# Patient Record
Sex: Female | Born: 1980 | Race: White | Hispanic: No | Marital: Married | Smoking: Never smoker
Health system: Southern US, Community
[De-identification: ages and names within clinical notes are randomized; demographics above are authoritative.]

## PROBLEM LIST (undated history)

## (undated) ENCOUNTER — Inpatient Hospital Stay (HOSPITAL_COMMUNITY): Payer: Self-pay

## (undated) DIAGNOSIS — O020 Blighted ovum and nonhydatidiform mole: Secondary | ICD-10-CM

## (undated) DIAGNOSIS — D696 Thrombocytopenia, unspecified: Secondary | ICD-10-CM

## (undated) DIAGNOSIS — R7611 Nonspecific reaction to tuberculin skin test without active tuberculosis: Secondary | ICD-10-CM

## (undated) DIAGNOSIS — O99119 Other diseases of the blood and blood-forming organs and certain disorders involving the immune mechanism complicating pregnancy, unspecified trimester: Secondary | ICD-10-CM

## (undated) DIAGNOSIS — Z3480 Encounter for supervision of other normal pregnancy, unspecified trimester: Principal | ICD-10-CM

## (undated) HISTORY — DX: Encounter for supervision of other normal pregnancy, unspecified trimester: Z34.80

## (undated) HISTORY — DX: Nonspecific reaction to tuberculin skin test without active tuberculosis: R76.11

## (undated) HISTORY — PX: DILATION AND CURETTAGE OF UTERUS: SHX78

## (undated) HISTORY — DX: Blighted ovum and nonhydatidiform mole: O02.0

## (undated) HISTORY — PX: WISDOM TOOTH EXTRACTION: SHX21

---
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
## 2010-01-18 DIAGNOSIS — O020 Blighted ovum and nonhydatidiform mole: Secondary | ICD-10-CM

## 2010-01-18 HISTORY — DX: Blighted ovum and nonhydatidiform mole: O02.0

## 2011-01-19 NOTE — L&D Delivery Note (Signed)
Delivery Note Tinzley Dalia is a 31 y.o. G3P1011 at [redacted]w[redacted]d who presented for induction of labor at 5 cm dilation due to history of precipitous delivery and distance from hospital. At 3:18 PM a viable female was delivered via Vaginal, Spontaneous Delivery (Presentation: OA).  APGAR: 9,9; weight pending. Placenta status: Intact, Spontaneous.  Cord: 3 vessels with the following complications: None.   Anesthesia: Epidural  Episiotomy: None Lacerations: 1st degree Suture Repair: 3.0 vicryl Est. Blood Loss (mL): 350  Mom to postpartum.  Baby to nursery-stable.  [name] 12/31/2011, 3:47 PM

## 2011-05-17 LAB — OB RESULTS CONSOLE ABO/RH: RH Type: POSITIVE

## 2011-05-17 LAB — OB RESULTS CONSOLE HGB/HCT, BLOOD: HCT: 42 %

## 2011-05-17 LAB — OB RESULTS CONSOLE GBS: GBS: NEGATIVE

## 2011-05-17 LAB — OB RESULTS CONSOLE HIV ANTIBODY (ROUTINE TESTING): HIV: NONREACTIVE

## 2011-05-17 LAB — OB RESULTS CONSOLE GC/CHLAMYDIA
Chlamydia: NEGATIVE
Gonorrhea: NEGATIVE

## 2011-05-17 LAB — OB RESULTS CONSOLE RUBELLA ANTIBODY, IGM: Rubella: IMMUNE

## 2011-10-04 ENCOUNTER — Ambulatory Visit (INDEPENDENT_AMBULATORY_CARE_PROVIDER_SITE_OTHER): Payer: BC Managed Care – PPO | Admitting: Physician Assistant

## 2011-10-04 ENCOUNTER — Encounter: Payer: Self-pay | Admitting: Physician Assistant

## 2011-10-04 VITALS — BP 127/77 | Temp 96.5°F | Ht 69.25 in | Wt 192.0 lb

## 2011-10-04 DIAGNOSIS — Z3480 Encounter for supervision of other normal pregnancy, unspecified trimester: Secondary | ICD-10-CM | POA: Insufficient documentation

## 2011-10-04 HISTORY — DX: Encounter for supervision of other normal pregnancy, unspecified trimester: Z34.80

## 2011-10-04 LAB — CBC
HCT: 39.1 % (ref 36.0–46.0)
MCV: 93.3 fL (ref 78.0–100.0)
Platelets: 184 10*3/uL (ref 150–400)
RBC: 4.19 MIL/uL (ref 3.87–5.11)
WBC: 10.3 10*3/uL (ref 4.0–10.5)

## 2011-10-04 NOTE — Patient Instructions (Signed)
Pregnancy - Third Trimester The third trimester of pregnancy (the last 3 months) is a period of the most rapid growth for you and your baby. The baby approaches a length of 20 inches and a weight of 6 to 10 pounds. The baby is adding on fat and getting ready for life outside your body. While inside, babies have periods of sleeping and waking, suck their thumbs, and hiccups. You can often feel small contractions of the uterus. This is false labor. It is also called Braxton-Hicks contractions. This is like a practice for labor. The usual problems in this stage of pregnancy include more difficulty breathing, swelling of the hands and feet from water retention, and having to urinate more often because of the uterus and baby pressing on your bladder.  PRENATAL EXAMS  Blood work may continue to be done during prenatal exams. These tests are done to check on your health and the probable health of your baby. Blood work is used to follow your blood levels (hemoglobin). Anemia (low hemoglobin) is common during pregnancy. Iron and vitamins are given to help prevent this. You may also continue to be checked for diabetes. Some of the past blood tests may be done again.   The size of the uterus is measured during each visit. This makes sure your baby is growing properly according to your pregnancy dates.   Your blood pressure is checked every prenatal visit. This is to make sure you are not getting toxemia.   Your urine is checked every prenatal visit for infection, diabetes and protein.   Your weight is checked at each visit. This is done to make sure gains are happening at the suggested rate and that you and your baby are growing normally.   Sometimes, an ultrasound is performed to confirm the position and the proper growth and development of the baby. This is a test done that bounces harmless sound waves off the baby so your caregiver can more accurately determine due dates.   Discuss the type of pain  medication and anesthesia you will have during your labor and delivery.   Discuss the possibility and anesthesia if a Cesarean Section might be necessary.   Inform your caregiver if there is any mental or physical violence at home.  Sometimes, a specialized non-stress test, contraction stress test and biophysical profile are done to make sure the baby is not having a problem. Checking the amniotic fluid surrounding the baby is called an amniocentesis. The amniotic fluid is removed by sticking a needle into the belly (abdomen). This is sometimes done near the end of pregnancy if an early delivery is required. In this case, it is done to help make sure the baby's lungs are mature enough for the baby to live outside of the womb. If the lungs are not mature and it is unsafe to deliver the baby, an injection of cortisone medication is given to the mother 1 to 2 days before the delivery. This helps the baby's lungs mature and makes it safer to deliver the baby. CHANGES OCCURING IN THE THIRD TRIMESTER OF PREGNANCY Your body goes through many changes during pregnancy. They vary from person to person. Talk to your caregiver about changes you notice and are concerned about.  During the last trimester, you have probably had an increase in your appetite. It is normal to have cravings for certain foods. This varies from person to person and pregnancy to pregnancy.   You may begin to get stretch marks on your hips,   abdomen, and breasts. These are normal changes in the body during pregnancy. There are no exercises or medications to take which prevent this change.   Constipation may be treated with a stool softener or adding bulk to your diet. Drinking lots of fluids, fiber in vegetables, fruits, and whole grains are helpful.   Exercising is also helpful. If you have been very active up until your pregnancy, most of these activities can be continued during your pregnancy. If you have been less active, it is helpful  to start an exercise program such as walking. Consult your caregiver before starting exercise programs.   Avoid all smoking, alcohol, un-prescribed drugs, herbs and "street drugs" during your pregnancy. These chemicals affect the formation and growth of the baby. Avoid chemicals throughout the pregnancy to ensure the delivery of a healthy infant.   Backache, varicose veins and hemorrhoids may develop or get worse.   You will tire more easily in the third trimester, which is normal.   The baby's movements may be stronger and more often.   You may become short of breath easily.   Your belly button may stick out.   A yellow discharge may leak from your breasts called colostrum.   You may have a bloody mucus discharge. This usually occurs a few days to a week before labor begins.  HOME CARE INSTRUCTIONS   Keep your caregiver's appointments. Follow your caregiver's instructions regarding medication use, exercise, and diet.   During pregnancy, you are providing food for you and your baby. Continue to eat regular, well-balanced meals. Choose foods such as meat, fish, milk and other low fat dairy products, vegetables, fruits, and whole-grain breads and cereals. Your caregiver will tell you of the ideal weight gain.   A physical sexual relationship may be continued throughout pregnancy if there are no other problems such as early (premature) leaking of amniotic fluid from the membranes, vaginal bleeding, or belly (abdominal) pain.   Exercise regularly if there are no restrictions. Check with your caregiver if you are unsure of the safety of your exercises. Greater weight gain will occur in the last 2 trimesters of pregnancy. Exercising helps:   Control your weight.   Get you in shape for labor and delivery.   You lose weight after you deliver.   Rest a lot with legs elevated, or as needed for leg cramps or low back pain.   Wear a good support or jogging bra for breast tenderness during  pregnancy. This may help if worn during sleep. Pads or tissues may be used in the bra if you are leaking colostrum.   Do not use hot tubs, steam rooms, or saunas.   Wear your seat belt when driving. This protects you and your baby if you are in an accident.   Avoid raw meat, cat litter boxes and soil used by cats. These carry germs that can cause birth defects in the baby.   It is easier to loose urine during pregnancy. Tightening up and strengthening the pelvic muscles will help with this problem. You can practice stopping your urination while you are going to the bathroom. These are the same muscles you need to strengthen. It is also the muscles you would use if you were trying to stop from passing gas. You can practice tightening these muscles up 10 times a set and repeating this about 3 times per day. Once you know what muscles to tighten up, do not perform these exercises during urination. It is more likely   to cause an infection by backing up the urine.   Ask for help if you have financial, counseling or nutritional needs during pregnancy. Your caregiver will be able to offer counseling for these needs as well as refer you for other special needs.   Make a list of emergency phone numbers and have them available.   Plan on getting help from family or friends when you go home from the hospital.   Make a trial run to the hospital.   Take prenatal classes with the father to understand, practice and ask questions about the labor and delivery.   Prepare the baby's room/nursery.   Do not travel out of the city unless it is absolutely necessary and with the advice of your caregiver.   Wear only low or no heal shoes to have better balance and prevent falling.  MEDICATIONS AND DRUG USE IN PREGNANCY  Take prenatal vitamins as directed. The vitamin should contain 1 milligram of folic acid. Keep all vitamins out of reach of children. Only a couple vitamins or tablets containing iron may be fatal  to a baby or young child when ingested.   Avoid use of all medications, including herbs, over-the-counter medications, not prescribed or suggested by your caregiver. Only take over-the-counter or prescription medicines for pain, discomfort, or fever as directed by your caregiver. Do not use aspirin, ibuprofen (Motrin, Advil, Nuprin) or naproxen (Aleve) unless OK'd by your caregiver.   Let your caregiver also know about herbs you may be using.   Alcohol is related to a number of birth defects. This includes fetal alcohol syndrome. All alcohol, in any form, should be avoided completely. Smoking will cause low birth rate and premature babies.   Street/illegal drugs are very harmful to the baby. They are absolutely forbidden. A baby born to an addicted mother will be addicted at birth. The baby will go through the same withdrawal an adult does.  SEEK MEDICAL CARE IF: You have any concerns or worries during your pregnancy. It is better to call with your questions if you feel they cannot wait, rather than worry about them. DECISIONS ABOUT CIRCUMCISION You may or may not know the sex of your baby. If you know your baby is a boy, it may be time to think about circumcision. Circumcision is the removal of the foreskin of the penis. This is the skin that covers the sensitive end of the penis. There is no proven medical need for this. Often this decision is made on what is popular at the time or based upon religious beliefs and social issues. You can discuss these issues with your caregiver or pediatrician. SEEK IMMEDIATE MEDICAL CARE IF:   An unexplained oral temperature above 102 F (38.9 C) develops, or as your caregiver suggests.   You have leaking of fluid from the vagina (birth canal). If leaking membranes are suspected, take your temperature and tell your caregiver of this when you call.   There is vaginal spotting, bleeding or passing clots. Tell your caregiver of the amount and how many pads are  used.   You develop a bad smelling vaginal discharge with a change in the color from clear to white.   You develop vomiting that lasts more than 24 hours.   You develop chills or fever.   You develop shortness of breath.   You develop burning on urination.   You loose more than 2 pounds of weight or gain more than 2 pounds of weight or as suggested by your   caregiver.   You notice sudden swelling of your face, hands, and feet or legs.   You develop belly (abdominal) pain. Round ligament discomfort is a common non-cancerous (benign) cause of abdominal pain in pregnancy. Your caregiver still must evaluate you.   You develop a severe headache that does not go away.   You develop visual problems, blurred or double vision.   If you have not felt your baby move for more than 1 hour. If you think the baby is not moving as much as usual, eat something with sugar in it and lie down on your left side for an hour. The baby should move at least 4 to 5 times per hour. Call right away if your baby moves less than that.   You fall, are in a car accident or any kind of trauma.   There is mental or physical violence at home.  Document Released: 12/29/2000 Document Revised: 12/24/2010 Document Reviewed: 07/03/2008 Exit[employer] Patient Information 2012 [employer]. 

## 2011-10-04 NOTE — Progress Notes (Signed)
p=72 

## 2011-10-04 NOTE — Progress Notes (Signed)
p-88  This pt is here with her husband today.  She has transferred her care from [facility] in [place].  She is doing her 28 wk labs today.  Last pap was 5/12 WNL

## 2011-10-04 NOTE — Progress Notes (Signed)
New OB transfer from [facility]. Uncomplicated pregnancy thus far. Uncomplicated SVD with previous pregnancy. History remarkable for Molar Pregnancy 2012, no MTX. Will need follow-up quants postpartum to <2 result. Glucola today. Flu and Tdap at next visit. Anticipatory guidance. Discussion of practice, CNMs/MD model. Desires no students/residents. OK with fellow at delivery.

## 2011-10-04 NOTE — Addendum Note (Signed)
Addended by: Granville Lewis on: 10/04/2011 11:49 AM   Modules accepted: Orders

## 2011-10-04 NOTE — Addendum Note (Signed)
Addended by: Granville Lewis on: 10/04/2011 02:11 PM   Modules accepted: Orders

## 2011-10-05 LAB — RPR

## 2011-10-18 ENCOUNTER — Ambulatory Visit (INDEPENDENT_AMBULATORY_CARE_PROVIDER_SITE_OTHER): Payer: BC Managed Care – PPO | Admitting: Obstetrics and Gynecology

## 2011-10-18 VITALS — BP 119/75 | Temp 98.5°F | Wt 192.0 lb

## 2011-10-18 DIAGNOSIS — Z23 Encounter for immunization: Secondary | ICD-10-CM

## 2011-10-18 DIAGNOSIS — Z3480 Encounter for supervision of other normal pregnancy, unspecified trimester: Secondary | ICD-10-CM

## 2011-10-18 MED ORDER — TETANUS-DIPHTH-ACELL PERTUSSIS 5-2.5-18.5 LF-MCG/0.5 IM SUSP: 0.5000 mL | Freq: Once | INTRAMUSCULAR | Status: DC

## 2011-10-18 MED ORDER — INFLUENZA VIRUS VACC SPLIT PF IM SUSP: 0.5000 mL | Freq: Once | INTRAMUSCULAR | Status: DC

## 2011-10-18 NOTE — Patient Instructions (Signed)
Pregnancy - Third Trimester The third trimester of pregnancy (the last 3 months) is a period of the most rapid growth for you and your baby. The baby approaches a length of 20 inches and a weight of 6 to 10 pounds. The baby is adding on fat and getting ready for life outside your body. While inside, babies have periods of sleeping and waking, suck their thumbs, and hiccups. You can often feel small contractions of the uterus. This is false labor. It is also called Braxton-Hicks contractions. This is like a practice for labor. The usual problems in this stage of pregnancy include more difficulty breathing, swelling of the hands and feet from water retention, and having to urinate more often because of the uterus and baby pressing on your bladder.  PRENATAL EXAMS  Blood work may continue to be done during prenatal exams. These tests are done to check on your health and the probable health of your baby. Blood work is used to follow your blood levels (hemoglobin). Anemia (low hemoglobin) is common during pregnancy. Iron and vitamins are given to help prevent this. You may also continue to be checked for diabetes. Some of the past blood tests may be done again.   The size of the uterus is measured during each visit. This makes sure your baby is growing properly according to your pregnancy dates.   Your blood pressure is checked every prenatal visit. This is to make sure you are not getting toxemia.   Your urine is checked every prenatal visit for infection, diabetes and protein.   Your weight is checked at each visit. This is done to make sure gains are happening at the suggested rate and that you and your baby are growing normally.   Sometimes, an ultrasound is performed to confirm the position and the proper growth and development of the baby. This is a test done that bounces harmless sound waves off the baby so your caregiver can more accurately determine due dates.   Discuss the type of pain  medication and anesthesia you will have during your labor and delivery.   Discuss the possibility and anesthesia if a Cesarean Section might be necessary.   Inform your caregiver if there is any mental or physical violence at home.  Sometimes, a specialized non-stress test, contraction stress test and biophysical profile are done to make sure the baby is not having a problem. Checking the amniotic fluid surrounding the baby is called an amniocentesis. The amniotic fluid is removed by sticking a needle into the belly (abdomen). This is sometimes done near the end of pregnancy if an early delivery is required. In this case, it is done to help make sure the baby's lungs are mature enough for the baby to live outside of the womb. If the lungs are not mature and it is unsafe to deliver the baby, an injection of cortisone medication is given to the mother 1 to 2 days before the delivery. This helps the baby's lungs mature and makes it safer to deliver the baby. CHANGES OCCURING IN THE THIRD TRIMESTER OF PREGNANCY Your body goes through many changes during pregnancy. They vary from person to person. Talk to your caregiver about changes you notice and are concerned about.  During the last trimester, you have probably had an increase in your appetite. It is normal to have cravings for certain foods. This varies from person to person and pregnancy to pregnancy.   You may begin to get stretch marks on your hips,   abdomen, and breasts. These are normal changes in the body during pregnancy. There are no exercises or medications to take which prevent this change.   Constipation may be treated with a stool softener or adding bulk to your diet. Drinking lots of fluids, fiber in vegetables, fruits, and whole grains are helpful.   Exercising is also helpful. If you have been very active up until your pregnancy, most of these activities can be continued during your pregnancy. If you have been less active, it is helpful  to start an exercise program such as walking. Consult your caregiver before starting exercise programs.   Avoid all smoking, alcohol, un-prescribed drugs, herbs and "street drugs" during your pregnancy. These chemicals affect the formation and growth of the baby. Avoid chemicals throughout the pregnancy to ensure the delivery of a healthy infant.   Backache, varicose veins and hemorrhoids may develop or get worse.   You will tire more easily in the third trimester, which is normal.   The baby's movements may be stronger and more often.   You may become short of breath easily.   Your belly button may stick out.   A yellow discharge may leak from your breasts called colostrum.   You may have a bloody mucus discharge. This usually occurs a few days to a week before labor begins.  HOME CARE INSTRUCTIONS   Keep your caregiver's appointments. Follow your caregiver's instructions regarding medication use, exercise, and diet.   During pregnancy, you are providing food for you and your baby. Continue to eat regular, well-balanced meals. Choose foods such as meat, fish, milk and other low fat dairy products, vegetables, fruits, and whole-grain breads and cereals. Your caregiver will tell you of the ideal weight gain.   A physical sexual relationship may be continued throughout pregnancy if there are no other problems such as early (premature) leaking of amniotic fluid from the membranes, vaginal bleeding, or belly (abdominal) pain.   Exercise regularly if there are no restrictions. Check with your caregiver if you are unsure of the safety of your exercises. Greater weight gain will occur in the last 2 trimesters of pregnancy. Exercising helps:   Control your weight.   Get you in shape for labor and delivery.   You lose weight after you deliver.   Rest a lot with legs elevated, or as needed for leg cramps or low back pain.   Wear a good support or jogging bra for breast tenderness during  pregnancy. This may help if worn during sleep. Pads or tissues may be used in the bra if you are leaking colostrum.   Do not use hot tubs, steam rooms, or saunas.   Wear your seat belt when driving. This protects you and your baby if you are in an accident.   Avoid raw meat, cat litter boxes and soil used by cats. These carry germs that can cause birth defects in the baby.   It is easier to loose urine during pregnancy. Tightening up and strengthening the pelvic muscles will help with this problem. You can practice stopping your urination while you are going to the bathroom. These are the same muscles you need to strengthen. It is also the muscles you would use if you were trying to stop from passing gas. You can practice tightening these muscles up 10 times a set and repeating this about 3 times per day. Once you know what muscles to tighten up, do not perform these exercises during urination. It is more likely   to cause an infection by backing up the urine.   Ask for help if you have financial, counseling or nutritional needs during pregnancy. Your caregiver will be able to offer counseling for these needs as well as refer you for other special needs.   Make a list of emergency phone numbers and have them available.   Plan on getting help from family or friends when you go home from the hospital.   Make a trial run to the hospital.   Take prenatal classes with the father to understand, practice and ask questions about the labor and delivery.   Prepare the baby's room/nursery.   Do not travel out of the city unless it is absolutely necessary and with the advice of your caregiver.   Wear only low or no heal shoes to have better balance and prevent falling.  MEDICATIONS AND DRUG USE IN PREGNANCY  Take prenatal vitamins as directed. The vitamin should contain 1 milligram of folic acid. Keep all vitamins out of reach of children. Only a couple vitamins or tablets containing iron may be fatal  to a baby or young child when ingested.   Avoid use of all medications, including herbs, over-the-counter medications, not prescribed or suggested by your caregiver. Only take over-the-counter or prescription medicines for pain, discomfort, or fever as directed by your caregiver. Do not use aspirin, ibuprofen (Motrin, Advil, Nuprin) or naproxen (Aleve) unless OK'd by your caregiver.   Let your caregiver also know about herbs you may be using.   Alcohol is related to a number of birth defects. This includes fetal alcohol syndrome. All alcohol, in any form, should be avoided completely. Smoking will cause low birth rate and premature babies.   Street/illegal drugs are very harmful to the baby. They are absolutely forbidden. A baby born to an addicted mother will be addicted at birth. The baby will go through the same withdrawal an adult does.  SEEK MEDICAL CARE IF: You have any concerns or worries during your pregnancy. It is better to call with your questions if you feel they cannot wait, rather than worry about them. DECISIONS ABOUT CIRCUMCISION You may or may not know the sex of your baby. If you know your baby is a boy, it may be time to think about circumcision. Circumcision is the removal of the foreskin of the penis. This is the skin that covers the sensitive end of the penis. There is no proven medical need for this. Often this decision is made on what is popular at the time or based upon religious beliefs and social issues. You can discuss these issues with your caregiver or pediatrician. SEEK IMMEDIATE MEDICAL CARE IF:   An unexplained oral temperature above 102 F (38.9 C) develops, or as your caregiver suggests.   You have leaking of fluid from the vagina (birth canal). If leaking membranes are suspected, take your temperature and tell your caregiver of this when you call.   There is vaginal spotting, bleeding or passing clots. Tell your caregiver of the amount and how many pads are  used.   You develop a bad smelling vaginal discharge with a change in the color from clear to white.   You develop vomiting that lasts more than 24 hours.   You develop chills or fever.   You develop shortness of breath.   You develop burning on urination.   You loose more than 2 pounds of weight or gain more than 2 pounds of weight or as suggested by your   caregiver.   You notice sudden swelling of your face, hands, and feet or legs.   You develop belly (abdominal) pain. Round ligament discomfort is a common non-cancerous (benign) cause of abdominal pain in pregnancy. Your caregiver still must evaluate you.   You develop a severe headache that does not go away.   You develop visual problems, blurred or double vision.   If you have not felt your baby move for more than 1 hour. If you think the baby is not moving as much as usual, eat something with sugar in it and lie down on your left side for an hour. The baby should move at least 4 to 5 times per hour. Call right away if your baby moves less than that.   You fall, are in a car accident or any kind of trauma.   There is mental or physical violence at home.  Document Released: 12/29/2000 Document Revised: 12/24/2010 Document Reviewed: 07/03/2008 Exit[employer] Patient Information 2012 [employer]. 

## 2011-10-18 NOTE — Progress Notes (Signed)
p-83  Noticing some vaginal swelling on occassion

## 2011-10-18 NOTE — Progress Notes (Signed)
Doing well. 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 Concerned that 1st labor only 4 hrs; lives [place]. Pregnancy discomforts and routines discussed. Flu and tDAP today. No irritative vaginitis.

## 2011-10-18 NOTE — Addendum Note (Signed)
Addended by: Granville Lewis on: 10/18/2011 12:05 PM   Modules accepted: Orders

## 2011-11-01 ENCOUNTER — Ambulatory Visit: Payer: BC Managed Care – PPO | Admitting: Advanced Practice Midwife

## 2011-11-01 NOTE — Progress Notes (Signed)
Doing well.  Good fetal movement, denies vaginal bleeding, LOF, regular contractions.  No complaints.

## 2011-11-01 NOTE — Progress Notes (Signed)
p-80 

## 2011-11-15 ENCOUNTER — Ambulatory Visit (INDEPENDENT_AMBULATORY_CARE_PROVIDER_SITE_OTHER): Payer: BC Managed Care – PPO | Admitting: Advanced Practice Midwife

## 2011-11-15 VITALS — BP 117/65 | Temp 97.1°F | Wt 200.0 lb

## 2011-11-15 DIAGNOSIS — Z3480 Encounter for supervision of other normal pregnancy, unspecified trimester: Secondary | ICD-10-CM

## 2011-11-15 NOTE — Addendum Note (Signed)
Addended by: Sharen Counter A on: 11/15/2011 10:20 AM   Modules accepted: Level of Service

## 2011-11-15 NOTE — Progress Notes (Signed)
Doing well.  Good fetal movement, denies vaginal bleeding, LOF, regular contractions.  Has had URI this week, felt warm but no chills.  Better today.

## 2011-11-15 NOTE — Progress Notes (Signed)
p=76 

## 2011-11-30 ENCOUNTER — Ambulatory Visit (INDEPENDENT_AMBULATORY_CARE_PROVIDER_SITE_OTHER): Payer: BC Managed Care – PPO | Admitting: Obstetrics & Gynecology

## 2011-11-30 DIAGNOSIS — Z3480 Encounter for supervision of other normal pregnancy, unspecified trimester: Secondary | ICD-10-CM

## 2011-11-30 NOTE — Progress Notes (Signed)
p-77 

## 2011-11-30 NOTE — Progress Notes (Signed)
No problems.  Will do cultures next week when past 36 weeks.  Pt went to almost 41 weeks with last pregnancy.  No problems.

## 2011-12-08 ENCOUNTER — Ambulatory Visit (INDEPENDENT_AMBULATORY_CARE_PROVIDER_SITE_OTHER): Payer: BC Managed Care – PPO | Admitting: Obstetrics & Gynecology

## 2011-12-08 VITALS — BP 116/73 | Wt 203.0 lb

## 2011-12-08 DIAGNOSIS — Z3480 Encounter for supervision of other normal pregnancy, unspecified trimester: Secondary | ICD-10-CM

## 2011-12-08 LAB — OB RESULTS CONSOLE GBS: GBS: NEGATIVE

## 2011-12-08 NOTE — Progress Notes (Signed)
p=82 

## 2011-12-08 NOTE — Progress Notes (Signed)
Pelvic cultures done. No other complaints or concerns.  Fetal movement and labor precautions reviewed.  

## 2011-12-08 NOTE — Patient Instructions (Signed)
Return to clinic for any obstetric concerns or go to [facility] for evaluation  

## 2011-12-09 LAB — GC/CHLAMYDIA PROBE AMP, GENITAL
Chlamydia, DNA Probe: NEGATIVE
GC Probe Amp, Genital: NEGATIVE

## 2011-12-13 ENCOUNTER — Encounter: Payer: Self-pay | Admitting: Obstetrics & Gynecology

## 2011-12-15 ENCOUNTER — Ambulatory Visit (INDEPENDENT_AMBULATORY_CARE_PROVIDER_SITE_OTHER): Payer: BC Managed Care – PPO | Admitting: Obstetrics & Gynecology

## 2011-12-15 ENCOUNTER — Encounter: Payer: Self-pay | Admitting: Obstetrics & Gynecology

## 2011-12-15 VITALS — BP 124/76 | Temp 98.0°F | Wt 204.0 lb

## 2011-12-15 DIAGNOSIS — Z3480 Encounter for supervision of other normal pregnancy, unspecified trimester: Secondary | ICD-10-CM

## 2011-12-15 NOTE — Progress Notes (Signed)
Routine visit. No OB problems. Good FM. Labor precautions reviewed. GBS negative.

## 2011-12-15 NOTE — Progress Notes (Signed)
p-81 

## 2011-12-22 ENCOUNTER — Ambulatory Visit (INDEPENDENT_AMBULATORY_CARE_PROVIDER_SITE_OTHER): Payer: BC Managed Care – PPO | Admitting: Obstetrics & Gynecology

## 2011-12-22 VITALS — BP 108/73 | Temp 98.0°F | Wt 204.0 lb

## 2011-12-22 DIAGNOSIS — Z3490 Encounter for supervision of normal pregnancy, unspecified, unspecified trimester: Secondary | ICD-10-CM

## 2011-12-22 DIAGNOSIS — Z3480 Encounter for supervision of other normal pregnancy, unspecified trimester: Secondary | ICD-10-CM

## 2011-12-22 NOTE — Progress Notes (Signed)
Discussed labor and induction.  Pt would like to wait until 41 weeks to induce.

## 2011-12-22 NOTE — Progress Notes (Signed)
p-74 

## 2011-12-22 NOTE — Patient Instructions (Signed)
Normal Labor and Delivery  Your caregiver must first be sure you are in labor. Signs of labor include:  · You may pass what is called "the mucus plug" before labor begins. This is a small amount of blood stained mucus.  · Regular uterine contractions.  · The time between contractions get closer together.  · The discomfort and pain gradually gets more intense.  · Pains are mostly located in the back.  · Pains get worse when walking.  · The cervix (the opening of the uterus becomes thinner (begins to efface) and opens up (dilates).  Once you are in labor and admitted into the hospital or care center, your caregiver will do the following:  · A complete physical examination.  · Check your vital signs (blood pressure, pulse, temperature and the fetal heart rate).  · Do a vaginal examination (using a sterile glove and lubricant) to determine:  · The position (presentation) of the baby (head [vertex] or buttock first).  · The level (station) of the baby's head in the birth canal.  · The effacement and dilatation of the cervix.  · You may have your pubic hair shaved and be given an enema depending on your caregiver and the circumstance.  · An electronic monitor is usually placed on your abdomen. The monitor follows the length and intensity of the contractions, as well as the baby's heart rate.  · Usually, your caregiver will insert an IV in your arm with a bottle of sugar water. This is done as a precaution so that medications can be given to you quickly during labor or delivery.  NORMAL LABOR AND DELIVERY IS DIVIDED UP INTO 3 STAGES:  First Stage  This is when regular contractions begin and the cervix begins to efface and dilate. This stage can last from 3 to 15 hours. The end of the first stage is when the cervix is 100% effaced and 10 centimeters dilated. Pain medications may be given by   · Injection (morphine, demerol, etc.)  · Regional anesthesia (spinal, caudal or epidural, anesthetics given in different locations of  the spine). Paracervical pain medication may be given, which is an injection of and anesthetic on each side of the cervix.  A pregnant woman may request to have "Natural Childbirth" which is not to have any medications or anesthesia during her labor and delivery.  Second Stage  This is when the baby comes down through the birth canal (vagina) and is born. This can take 1 to 4 hours. As the baby's head comes down through the birth canal, you may feel like you are going to have a bowel movement. You will get the urge to bear down and push until the baby is delivered. As the baby's head is being delivered, the caregiver will decide if an episiotomy (a cut in the perineum and vagina area) is needed to prevent tearing of the tissue in this area. The episiotomy is sewn up after the delivery of the baby and placenta. Sometimes a mask with nitrous oxide is given for the mother to breath during the delivery of the baby to help if there is too much pain. The end of Stage 2 is when the baby is fully delivered. Then when the umbilical cord stops pulsating it is clamped and cut.  Third Stage  The third stage begins after the baby is completely delivered and ends after the placenta (afterbirth) is delivered. This usually takes 5 to 30 minutes. After the placenta is delivered, a medication   is given either by intravenous or injection to help contract the uterus and prevent bleeding. The third stage is not painful and pain medication is usually not necessary. If an episiotomy was done, it is repaired at this time.  After the delivery, the mother is watched and monitored closely for 1 to 2 hours to make sure there is no postpartum bleeding (hemorrhage). If there is a lot of bleeding, medication is given to contract the uterus and stop the bleeding.  Document Released: 10/14/2007 Document Revised: 03/29/2011 Document Reviewed: 10/14/2007  ExitCare® Patient Information ©2013 [employer].

## 2011-12-28 ENCOUNTER — Inpatient Hospital Stay (HOSPITAL_COMMUNITY): Admission: AD | Admit: 2011-12-28 | Payer: Self-pay | Source: Ambulatory Visit | Admitting: Family Medicine

## 2011-12-29 ENCOUNTER — Ambulatory Visit (INDEPENDENT_AMBULATORY_CARE_PROVIDER_SITE_OTHER): Payer: BC Managed Care – PPO | Admitting: Obstetrics & Gynecology

## 2011-12-29 ENCOUNTER — Telehealth (HOSPITAL_COMMUNITY): Payer: Self-pay | Admitting: *Deleted

## 2011-12-29 ENCOUNTER — Encounter: Payer: Self-pay | Admitting: Obstetrics & Gynecology

## 2011-12-29 VITALS — BP 116/76 | Temp 98.5°F | Wt 205.0 lb

## 2011-12-29 DIAGNOSIS — Z3480 Encounter for supervision of other normal pregnancy, unspecified trimester: Secondary | ICD-10-CM

## 2011-12-29 NOTE — Progress Notes (Signed)
Routine visit. No OB problems. Good FM. Labor precautions reviewed. Per her request, IOL schedule for this Friday at 7 am.

## 2011-12-29 NOTE — Progress Notes (Signed)
p=70 

## 2011-12-29 NOTE — Telephone Encounter (Signed)
Preadmission screen  

## 2011-12-30 ENCOUNTER — Telehealth (HOSPITAL_COMMUNITY): Payer: Self-pay | Admitting: *Deleted

## 2011-12-30 ENCOUNTER — Encounter (HOSPITAL_COMMUNITY): Payer: Self-pay | Admitting: *Deleted

## 2011-12-30 NOTE — Telephone Encounter (Signed)
Preadmission screen  

## 2011-12-31 ENCOUNTER — Inpatient Hospital Stay (HOSPITAL_COMMUNITY): Payer: BC Managed Care – PPO | Admitting: Anesthesiology

## 2011-12-31 ENCOUNTER — Encounter (HOSPITAL_COMMUNITY): Payer: Self-pay | Admitting: Anesthesiology

## 2011-12-31 ENCOUNTER — Encounter (HOSPITAL_COMMUNITY): Payer: Self-pay

## 2011-12-31 ENCOUNTER — Inpatient Hospital Stay (HOSPITAL_COMMUNITY)
Admission: RE | Admit: 2011-12-31 | Discharge: 2012-01-02 | DRG: 373 | Disposition: A | Discharge status: Home / Self Care | Payer: BC Managed Care – PPO | Source: Ambulatory Visit | Attending: Obstetrics & Gynecology | Admitting: Obstetrics & Gynecology

## 2011-12-31 VITALS — BP 106/64 | HR 66 | Temp 98.2°F | Resp 16 | Ht 70.5 in | Wt 204.0 lb

## 2011-12-31 DIAGNOSIS — Z3480 Encounter for supervision of other normal pregnancy, unspecified trimester: Secondary | ICD-10-CM

## 2011-12-31 LAB — CBC
Hemoglobin: 12.9 g/dL (ref 12.0–15.0)
MCH: 32.1 pg (ref 26.0–34.0)
MCV: 93.5 fL (ref 78.0–100.0)
Platelets: 148 10*3/uL — ABNORMAL LOW (ref 150–400)
RBC: 4.02 MIL/uL (ref 3.87–5.11)
WBC: 10.6 10*3/uL — ABNORMAL HIGH (ref 4.0–10.5)

## 2011-12-31 MED ORDER — OXYTOCIN BOLUS FROM INFUSION: 500.0000 mL | INTRAVENOUS | Status: DC

## 2011-12-31 MED ORDER — LACTATED RINGERS IV SOLN: 500.0000 mL | INTRAVENOUS | Status: DC | PRN

## 2011-12-31 MED ORDER — PHENYLEPHRINE 40 MCG/ML (10ML) SYRINGE FOR IV PUSH (FOR BLOOD PRESSURE SUPPORT)
80.0000 ug | PREFILLED_SYRINGE | INTRAVENOUS | Status: DC | PRN
  Filled 2011-12-31: qty 5

## 2011-12-31 MED ORDER — DIPHENHYDRAMINE HCL 50 MG/ML IJ SOLN: 12.5000 mg | INTRAMUSCULAR | Status: DC | PRN

## 2011-12-31 MED ORDER — PRENATAL MULTIVITAMIN CH
1.0000 | ORAL_TABLET | Freq: Every day | ORAL | Status: DC
  Administered 2012-01-01 – 2012-01-02 (×2): 1 via ORAL
  Filled 2011-12-31 (×2): qty 1

## 2011-12-31 MED ORDER — TETANUS-DIPHTH-ACELL PERTUSSIS 5-2.5-18.5 LF-MCG/0.5 IM SUSP: 0.5000 mL | Freq: Once | INTRAMUSCULAR | Status: DC

## 2011-12-31 MED ORDER — LACTATED RINGERS IV SOLN
500.0000 mL | Freq: Once | INTRAVENOUS | Status: AC
  Administered 2011-12-31: 500 mL via INTRAVENOUS

## 2011-12-31 MED ORDER — OXYTOCIN 40 UNITS IN LACTATED RINGERS INFUSION - SIMPLE MED
62.5000 mL/h | INTRAVENOUS | Status: DC
  Administered 2011-12-31: 62.5 mL/h via INTRAVENOUS

## 2011-12-31 MED ORDER — TERBUTALINE SULFATE 1 MG/ML IJ SOLN: 0.2500 mg | Freq: Once | INTRAMUSCULAR | Status: DC | PRN

## 2011-12-31 MED ORDER — LANOLIN HYDROUS EX OINT: TOPICAL_OINTMENT | CUTANEOUS | Status: DC | PRN

## 2011-12-31 MED ORDER — ATROPINE SULFATE 0.4 MG/ML IJ SOLN
0.4000 mg | Freq: Once | INTRAMUSCULAR | Status: AC | PRN
  Administered 2011-12-31: 0.2 mg via INTRAVENOUS
  Filled 2011-12-31: qty 1

## 2011-12-31 MED ORDER — WITCH HAZEL-GLYCERIN EX PADS: 1.0000 "application " | MEDICATED_PAD | CUTANEOUS | Status: DC | PRN

## 2011-12-31 MED ORDER — PHENYLEPHRINE 40 MCG/ML (10ML) SYRINGE FOR IV PUSH (FOR BLOOD PRESSURE SUPPORT): 80.0000 ug | PREFILLED_SYRINGE | INTRAVENOUS | Status: DC | PRN

## 2011-12-31 MED ORDER — CITRIC ACID-SODIUM CITRATE 334-500 MG/5ML PO SOLN: 30.0000 mL | ORAL | Status: DC | PRN

## 2011-12-31 MED ORDER — OXYCODONE-ACETAMINOPHEN 5-325 MG PO TABS: 1.0000 | ORAL_TABLET | ORAL | Status: DC | PRN

## 2011-12-31 MED ORDER — FENTANYL 2.5 MCG/ML BUPIVACAINE 1/10 % EPIDURAL INFUSION (WH - ANES)
14.0000 mL/h | INTRAMUSCULAR | Status: DC
  Administered 2011-12-31: 16 mL/h via EPIDURAL
  Filled 2011-12-31: qty 125

## 2011-12-31 MED ORDER — NALBUPHINE SYRINGE 5 MG/0.5 ML: 10.0000 mg | INJECTION | INTRAMUSCULAR | Status: DC | PRN

## 2011-12-31 MED ORDER — SODIUM BICARBONATE 8.4 % IV SOLN
INTRAVENOUS | Status: DC | PRN
  Administered 2011-12-31: 5 mL via EPIDURAL

## 2011-12-31 MED ORDER — IBUPROFEN 600 MG PO TABS: 600.0000 mg | ORAL_TABLET | Freq: Four times a day (QID) | ORAL | Status: DC | PRN

## 2011-12-31 MED ORDER — SIMETHICONE 80 MG PO CHEW: 80.0000 mg | CHEWABLE_TABLET | ORAL | Status: DC | PRN

## 2011-12-31 MED ORDER — DIPHENHYDRAMINE HCL 25 MG PO CAPS: 25.0000 mg | ORAL_CAPSULE | Freq: Four times a day (QID) | ORAL | Status: DC | PRN

## 2011-12-31 MED ORDER — ONDANSETRON HCL 4 MG/2ML IJ SOLN: 4.0000 mg | INTRAMUSCULAR | Status: DC | PRN

## 2011-12-31 MED ORDER — ACETAMINOPHEN 325 MG PO TABS: 650.0000 mg | ORAL_TABLET | ORAL | Status: DC | PRN

## 2011-12-31 MED ORDER — EPHEDRINE 5 MG/ML INJ
10.0000 mg | INTRAVENOUS | Status: DC | PRN
  Filled 2011-12-31: qty 4

## 2011-12-31 MED ORDER — OXYTOCIN 40 UNITS IN LACTATED RINGERS INFUSION - SIMPLE MED
1.0000 m[IU]/min | INTRAVENOUS | Status: DC
  Administered 2011-12-31: 2 m[IU]/min via INTRAVENOUS
  Filled 2011-12-31: qty 1000

## 2011-12-31 MED ORDER — ONDANSETRON HCL 4 MG/2ML IJ SOLN
4.0000 mg | Freq: Four times a day (QID) | INTRAMUSCULAR | Status: DC | PRN
  Administered 2011-12-31: 4 mg via INTRAVENOUS
  Filled 2011-12-31: qty 2

## 2011-12-31 MED ORDER — ZOLPIDEM TARTRATE 5 MG PO TABS: 5.0000 mg | ORAL_TABLET | Freq: Every evening | ORAL | Status: DC | PRN

## 2011-12-31 MED ORDER — ONDANSETRON HCL 4 MG PO TABS: 4.0000 mg | ORAL_TABLET | ORAL | Status: DC | PRN

## 2011-12-31 MED ORDER — LIDOCAINE HCL (PF) 1 % IJ SOLN: 30.0000 mL | INTRAMUSCULAR | Status: DC | PRN

## 2011-12-31 MED ORDER — BENZOCAINE-MENTHOL 20-0.5 % EX AERO
1.0000 "application " | INHALATION_SPRAY | CUTANEOUS | Status: DC | PRN
  Administered 2011-12-31: 1 via TOPICAL
  Filled 2011-12-31: qty 56

## 2011-12-31 MED ORDER — SENNOSIDES-DOCUSATE SODIUM 8.6-50 MG PO TABS
2.0000 | ORAL_TABLET | Freq: Every day | ORAL | Status: DC
  Administered 2011-12-31 – 2012-01-01 (×2): 2 via ORAL

## 2011-12-31 MED ORDER — LACTATED RINGERS IV SOLN
INTRAVENOUS | Status: DC
  Administered 2011-12-31 (×2): via INTRAVENOUS

## 2011-12-31 MED ORDER — EPHEDRINE 5 MG/ML INJ: 10.0000 mg | INTRAVENOUS | Status: DC | PRN

## 2011-12-31 MED ORDER — DIBUCAINE 1 % RE OINT: 1.0000 "application " | TOPICAL_OINTMENT | RECTAL | Status: DC | PRN

## 2011-12-31 MED ORDER — IBUPROFEN 600 MG PO TABS
600.0000 mg | ORAL_TABLET | Freq: Four times a day (QID) | ORAL | Status: DC
  Administered 2011-12-31 – 2012-01-02 (×7): 600 mg via ORAL
  Filled 2011-12-31 (×7): qty 1

## 2011-12-31 NOTE — Anesthesia Preprocedure Evaluation (Signed)

## 2011-12-31 NOTE — Progress Notes (Signed)
Spoke with Dr. Sherron Ales and notified him of pt request for epidural. He ordered to have 0.4 mg Atropine at bedside due to pt vagal response to IV sticks, vaginal exams. Pt becomes very symptomatic with vagal response.

## 2011-12-31 NOTE — Anesthesia Procedure Notes (Signed)

## 2011-12-31 NOTE — Progress Notes (Signed)
Danielle Vincent is a 31 y.o. G3P1011 at [redacted]w[redacted]d admitted for induction of labor due to advanced dilation, hx precipitous delivery, distance from hospital.  Subjective: Blocked and comfortable.   Objective: BP 120/63  Pulse 58  Temp 98.5 F (36.9 C) (Oral)  Resp 20  Ht 5' 10.5" (1.791 m)  Wt 92.534 kg (204 lb)  BMI 28.86 kg/m2  SpO2 87%  LMP 03/19/2011      FHT:  FHR: 130 bpm, variability: moderate,  accelerations:  Present,  decelerations:  Absent UC:   regular, every 3-4 minutes SVE:   Dilation: 5 Effacement (%): 70 Station: +1 Exam by:: Dr. Thad Vincent Anterior, very stretchy cervix   Labs: Lab Results  Component Value Date   WBC 10.6* 12/31/2011   HGB 12.9 12/31/2011   HCT 37.6 12/31/2011   MCV 93.5 12/31/2011   PLT 148* 12/31/2011    Assessment / Plan: IOL progressing well on pitocin  Labor: progressing on pitocin, AROM clear Preeclampsia:  n/a Fetal Wellbeing:  Category I Pain Control:  Epidural I/D:  n/a Anticipated MOD:  NSVD  Danielle Vincent 12/31/2011, 12:21 PM

## 2011-12-31 NOTE — H&P (Signed)
Danielle Vincent is a 31 y.o. female presenting for IOL for advanced dilation (5 cm in office), hx fast labor and distance from hospital. Maternal Medical History:  Reason for admission: Reason for Admission:   nauseaIOL for advanced dilation, distance from hospital  Fetal activity: Perceived fetal activity is normal.   Last perceived fetal movement was within the past hour.    Prenatal complications: no prenatal complications Prenatal Complications - Diabetes: none.    OB History    Grav Para Term Preterm Abortions TAB SAB Ect Mult Living   3 1 1  1  1   1      Past Medical History  Diagnosis Date  . Supervision of other normal pregnancy 10/04/2011     Genetic Screen Neg [place], XX Anatomic [place] Normal female Glucose Screen  GBS  Feeding Preference Breast Contraception  Circumcision n/a    . Molar pregnancy 2012  . Positive PPD, treated    Past Surgical History  Procedure Date  . Knee surgery 1995  . Dilation and curettage of uterus    Family History: family history includes Cancer in her maternal grandmother; Diabetes in her maternal grandmother, paternal grandfather, and paternal grandmother; Hyperlipidemia in her maternal grandmother and mother; and Osteoporosis in her maternal grandmother. Social History:  reports that she has never smoked. She has never used smokeless tobacco. She reports that she does not drink alcohol or use illicit drugs.   Prenatal Transfer Tool  Maternal Diabetes: No Genetic Screening: Normal Maternal Ultrasounds/Referrals: Normal Fetal Ultrasounds or other Referrals:  None Maternal Substance Abuse:  No Significant Maternal Medications:  None Significant Maternal Lab Results:  Lab values include: Group B Strep negative Other Comments:  None  Review of Systems  Constitutional: Negative for fever and chills.  Eyes: Negative for blurred vision and double vision.  Gastrointestinal: Negative for nausea and vomiting.  Genitourinary: Negative  for dysuria.  Neurological: Negative for headaches.      Blood pressure 119/66, pulse 66, temperature 98.5 F (36.9 C), temperature source Oral, resp. rate 20, height 5' 10.5" (1.791 m), weight 92.534 kg (204 lb), last menstrual period 03/19/2011. Maternal Exam:  Pelvis: adequate for delivery.   Cervix: Cervix evaluated by digital exam.     Fetal Exam Fetal Monitor Review: Mode: ultrasound.   Baseline rate: 135.  Variability: moderate (6-25 bpm).   Pattern: accelerations present and no decelerations.       Physical Exam  Constitutional: She is oriented to person, place, and time. She appears well-developed and well-nourished. No distress.  HENT:  Head: Normocephalic and atraumatic.  Eyes: Conjunctivae normal and EOM are normal.  Neck: Normal range of motion.  Cardiovascular: Normal rate.   Respiratory: Effort normal. No respiratory distress.  GI: There is no tenderness.  Musculoskeletal: Normal range of motion. She exhibits no edema and no tenderness.  Neurological: She is alert and oriented to person, place, and time.  Skin: Skin is warm and dry.  Psychiatric: She has a normal mood and affect.    Prenatal labs: ABO, Rh: A/Positive/-- (04/29 0000) Antibody: Negative (04/29 0000) Rubella: Immune (04/29 0000) RPR: NON REAC (09/16 1023)  HBsAg: Negative (04/29 0000)  HIV: NON REACTIVE (09/16 1023)  GBS: Negative (11/20 0000)   Assessment/Plan: 31 y.o. G3P1011 at [redacted]w[redacted]d with advanced dilation - IOL with pitocin - AROM when ready - Epidural as needed.   [name] 12/31/2011, 9:16 AM

## 2012-01-01 LAB — CBC
Hemoglobin: 12.2 g/dL (ref 12.0–15.0)
MCH: 31.6 pg (ref 26.0–34.0)
Platelets: 145 10*3/uL — ABNORMAL LOW (ref 150–400)
RBC: 3.86 MIL/uL — ABNORMAL LOW (ref 3.87–5.11)

## 2012-01-01 NOTE — Anesthesia Postprocedure Evaluation (Signed)
  Anesthesia Post-op Note  Patient: Danielle Vincent  Procedure(s) Performed: * No procedures listed *  Patient Location: Mother/Baby  Anesthesia Type:Epidural  Level of Consciousness: awake, alert  and oriented  Airway and Oxygen Therapy: Patient Spontanous Breathing  Post-op Pain: mild  Post-op Assessment: Patient's Cardiovascular Status Stable, Respiratory Function Stable, Patent Airway, No signs of Nausea or vomiting, Pain level controlled and No headache  Post-op Vital Signs: stable  Complications: No apparent anesthesia complications

## 2012-01-01 NOTE — Progress Notes (Signed)
Post Partum Day #1 Subjective: no complaints and up ad lib; breastfeeding; considering NuvaRing once milk established; declines early d/c   Objective: Blood pressure 102/58, pulse 64, temperature 97.6 F (36.4 C), temperature source Oral, resp. rate 18, height 5' 10.5" (1.791 m), weight 92.534 kg (204 lb), last menstrual period 03/19/2011, SpO2 87.00%, unknown if currently breastfeeding.  Physical Exam:  General: alert, cooperative and no distress Lochia: appropriate Uterine Fundus: firm DVT Evaluation: No evidence of DVT seen on physical exam.   Basename 01/01/12 0530 12/31/11 0825  HGB 12.2 12.9  HCT 36.3 37.6    Assessment/Plan: Plan for discharge tomorrow   LOS: 1 day   Cam Hai 01/01/2012, 7:44 AM

## 2012-01-02 MED ORDER — IBUPROFEN 600 MG PO TABS: 600.0000 mg | ORAL_TABLET | Freq: Four times a day (QID) | ORAL | Status: DC | PRN

## 2012-01-02 NOTE — Discharge Summary (Signed)
Obstetric Discharge Summary Reason for Admission: induction of labor d/t 5cm in office, h/o rapid labor, long distance from hosp Prenatal Procedures: ultrasound Intrapartum Procedures: spontaneous vaginal delivery Postpartum Procedures: none Complications-Operative and Postpartum: 1st degree perineal laceration Eating, drinking, voiding, ambulating well.  +flatus.  Lochia and pain wnl.  No complaints.   Hemoglobin  Date Value Range Status  01/01/2012 12.2  12.0 - 15.0 g/dL Final  1/61/0960 45.4   Final     HCT  Date Value Range Status  01/01/2012 36.3  36.0 - 46.0 % Final  05/17/2011 42   Final    Physical Exam:  General: alert, cooperative and no distress Lochia: appropriate Uterine Fundus: firm Incision: n/a DVT Evaluation: No evidence of DVT seen on physical exam. Negative Homan's sign. No cords or calf tenderness. No significant calf/ankle edema.  Discharge Diagnoses: Term Pregnancy-delivered  Discharge Information: Date: 01/02/2012 Activity: pelvic rest Diet: routine Medications: PNV and Ibuprofen Condition: stable Instructions: refer to practice specific booklet Discharge to: home Follow-up Information    Schedule an appointment as soon as possible for a visit with [facility]. (in 4-6 weeks for your postpartum visit. )    Contact information:   [address] [place] [address]          Newborn Data: Live born female  Birth Weight: 7 lb 4.4 oz (3300 g) APGAR: 9, 9  Home with mother. Breastfeeding well. States wants nuvaring in 3months when milk supply well established.   Marge Duncans 01/02/2012, 6:55 AM

## 2012-01-03 NOTE — Discharge Summary (Signed)
Attestation of Attending Supervision of Advanced Practitioner (CNM/NP): Evaluation and management procedures were performed by the Advanced Practitioner under my supervision and collaboration.  I have reviewed the Advanced Practitioner's note and chart, and I agree with the management and plan.  Meloney Feld 01/03/2012 3:42 PM   

## 2012-01-03 NOTE — H&P (Signed)
Attestation of Attending Supervision of Advanced Practitioner (CNM/NP): Evaluation and management procedures were performed by the Advanced Practitioner under my supervision and collaboration.  I have reviewed the Advanced Practitioner's note and chart, and I agree with the management and plan.  Sutter Ahlgren 01/03/2012 3:42 PM   

## 2012-02-18 ENCOUNTER — Ambulatory Visit: Payer: BC Managed Care – PPO

## 2012-02-25 ENCOUNTER — Encounter: Payer: Self-pay | Admitting: Advanced Practice Midwife

## 2012-02-25 ENCOUNTER — Ambulatory Visit (INDEPENDENT_AMBULATORY_CARE_PROVIDER_SITE_OTHER): Payer: BC Managed Care – PPO | Admitting: Advanced Practice Midwife

## 2012-02-25 VITALS — BP 141/68 | HR 71 | Resp 16 | Ht 70.0 in | Wt 186.0 lb

## 2012-02-25 DIAGNOSIS — O09A0 Supervision of pregnancy with history of molar pregnancy, unspecified trimester: Secondary | ICD-10-CM

## 2012-02-25 LAB — HCG, QUANTITATIVE, PREGNANCY: hCG, Beta Chain, Quant, S: <2 m[IU]/mL

## 2012-02-25 MED ORDER — ETONOGESTREL-ETHINYL ESTRADIOL 0.12-0.015 MG/24HR VA RING: VAGINAL_RING | VAGINAL | Status: DC

## 2012-02-25 NOTE — Progress Notes (Signed)
  Subjective:     Danielle Vincent is a 32 y.o. female who presents for a postpartum visit. She is 7 weeks postpartum following a spontaneous vaginal delivery with first degree laceration. I have fully reviewed the prenatal and intrapartum course.  Outcome: spontaneous vaginal delivery. Anesthesia: epidural. Postpartum course has been normal. Baby's course has been normal. Baby is feeding by breast. Bleeding no bleeding. Bowel function is normal. Bladder function is normal. Patient is sexually active. Contraception method is none. Postpartum depression screening: negative.  The following portions of the patient's history were reviewed and updated as appropriate: allergies, current medications, past family history, past medical history, past social history, past surgical history and problem list.  Review of Systems A comprehensive review of systems was negative.   Objective:    BP 141/68  Pulse 71  Resp 16  Ht 5\' 10"  (1.778 m)  Wt 186 lb (84.369 kg)  BMI 26.69 kg/m2  Breastfeeding? Yes  General:  alert, cooperative, appears stated age and no distress   Breasts:  negative  Lungs: clear to auscultation bilaterally  Heart:  regular rate and rhythm, S1, S2 normal, no murmur, click, rub or gallop  Abdomen: soft, non-tender; bowel sounds normal; no masses,  no organomegaly   Vulva:  not evaluated  Vagina: not evaluated  Cervix:  not evaluated  Corpus: not examined  Adnexa:  not evaluated  Rectal Exam: Not performed.        Assessment:    Routine postpartum exam. Pap smear not done at today's visit.   Plan:    1. Contraception: NuvaRing vaginal inserts. Discussed potential effects on breastmilk, pt aware.   2. Follow up in: 1 year or as needed.

## 2012-02-28 ENCOUNTER — Telehealth: Payer: Self-pay | Admitting: *Deleted

## 2012-02-28 NOTE — Telephone Encounter (Signed)
LM on voicemail of neg BHCG results 

## 2013-01-18 NOTE — L&D Delivery Note (Signed)
Delivery Note At 8:00 AM a viable female was delivered via Vaginal, Spontaneous Delivery (Presentation: Left Occiput Anterior).  APGAR: 9, 9; weight .   Placenta status: Intact, Spontaneous.  Cord: 3 vessels with the following complications: Short.  Anesthesia: Epidural  Episiotomy: None Lacerations: small bleeding abrasion Suture Repair: vicryl rapide 4.0 Est. Blood Loss (mL): 200  Mom to postpartum.  Baby to Couplet care / Skin to Skin.  Danielle Vincent [name] 10/14/2013, 8:52 AM

## 2013-03-14 ENCOUNTER — Encounter: Payer: Self-pay | Admitting: Advanced Practice Midwife

## 2013-03-14 ENCOUNTER — Ambulatory Visit (INDEPENDENT_AMBULATORY_CARE_PROVIDER_SITE_OTHER): Payer: BC Managed Care – PPO | Admitting: Advanced Practice Midwife

## 2013-03-14 VITALS — BP 124/71 | Wt 164.0 lb

## 2013-03-14 DIAGNOSIS — Z124 Encounter for screening for malignant neoplasm of cervix: Secondary | ICD-10-CM

## 2013-03-14 DIAGNOSIS — Z1151 Encounter for screening for human papillomavirus (HPV): Secondary | ICD-10-CM

## 2013-03-14 DIAGNOSIS — Z3480 Encounter for supervision of other normal pregnancy, unspecified trimester: Secondary | ICD-10-CM | POA: Insufficient documentation

## 2013-03-14 DIAGNOSIS — Z23 Encounter for immunization: Secondary | ICD-10-CM

## 2013-03-14 NOTE — Progress Notes (Signed)
p-82  Bedside U/S  Showed IUP with CRL of 28.8 and FHT 176

## 2013-03-14 NOTE — Progress Notes (Signed)
Subjective:    Danielle Vincent is a [id number] [redacted]w[redacted]d being seen today for her first obstetrical visit.  Her obstetrical history is significant for NSVD at term x2 and molar pregnancy x1. Patient does intend to breast feed. Pregnancy history fully reviewed.  Patient reports nausea and denies need for treatment at this time.  Filed Vitals:   03/14/13 1334  BP: 124/71  Weight: 164 lb (74.39 kg)    HISTORY: OB History  Gravida Para Term Preterm AB SAB TAB Ectopic Multiple Living  4 2 2  1 1    2     # Outcome Date GA Lbr Len/2nd Weight Sex Delivery Anes PTL Lv  4 CUR           3 TRM 12/31/11 [redacted]w[redacted]d 01:06 / 00:12 7 lb 4.4 oz (3.3 kg) F SVD EPI  Y     Comments: na  2 TRM 02/11/09 [redacted]w[redacted]d  7 lb 13 oz (3.544 kg) F SVD EPI  Y  1 SAB              Past Medical History  Diagnosis Date  . Supervision of other normal pregnancy 10/04/2011     Genetic Screen Neg [place], XX Anatomic [place] Normal female Glucose Screen  GBS  Feeding Preference Breast Contraception  Circumcision n/a    . Molar pregnancy 2012  . Positive PPD, treated    Past Surgical History  Procedure Laterality Date  . Knee surgery  1995  . Dilation and curettage of uterus     Family History  Problem Relation Age of Onset  . Cancer Maternal Grandmother     lung  . Diabetes Paternal Grandmother   . Diabetes Paternal Grandfather   . Diabetes Maternal Grandmother   . Hyperlipidemia Mother   . Hyperlipidemia Maternal Grandmother   . Osteoporosis Maternal Grandmother      Exam    Uterus:   ~9 week size  Pelvic Exam:    Perineum: No Hemorrhoids, Normal Perineum   Vulva: normal   Vagina:  normal mucosa, normal discharge   pH:    Cervix: multiparous appearance, no bleeding following Pap, no cervical motion tenderness and no lesions   Adnexa: normal adnexa and no mass, fullness, tenderness   Bony Pelvis: average  System: Breast:  normal appearance, no masses or tenderness   Skin: normal coloration and turgor, no  rashes    Neurologic: oriented, normal, gait normal; reflexes normal and symmetric   Extremities: normal strength, tone, and muscle mass, ROM of all joints is normal   HEENT neck supple with midline trachea and thyroid without masses   Mouth/Teeth mucous membranes moist, pharynx normal without lesions   Neck supple and no masses   Cardiovascular: regular rate and rhythm   Respiratory:  appears well, vitals normal, no respiratory distress, acyanotic, normal RR, ear and throat exam is normal, neck free of mass or lymphadenopathy, chest clear, no wheezing, crepitations, rhonchi, normal symmetric air entry   Abdomen: soft, non-tender; bowel sounds normal; no masses,  no organomegaly   Urinary: urethral meatus normal      Assessment:    Pregnancy: [id number] There are no active problems to display for this patient.       Plan:     Initial labs drawn. Prenatal vitamins. Problem list reviewed and updated. Genetic Screening discussed : Panorama ordered and drawn in office today at pt request.  Ultrasound discussed; fetal survey: requested.  Follow up in 4 weeks. 50% of 30 min visit  spent on counseling and coordination of care.     [name], [name] 03/14/2013

## 2013-03-15 ENCOUNTER — Encounter: Payer: Self-pay | Admitting: Advanced Practice Midwife

## 2013-03-15 DIAGNOSIS — O99119 Other diseases of the blood and blood-forming organs and certain disorders involving the immune mechanism complicating pregnancy, unspecified trimester: Secondary | ICD-10-CM

## 2013-03-15 DIAGNOSIS — D696 Thrombocytopenia, unspecified: Secondary | ICD-10-CM | POA: Insufficient documentation

## 2013-03-15 LAB — OBSTETRIC PANEL
Antibody Screen: NEGATIVE
Basophils Absolute: 0 10*3/uL (ref 0.0–0.1)
Basophils Relative: 0 % (ref 0–1)
EOS ABS: 0 10*3/uL (ref 0.0–0.7)
EOS PCT: 0 % (ref 0–5)
HEMATOCRIT: 39.1 % (ref 36.0–46.0)
HEMOGLOBIN: 13.2 g/dL (ref 12.0–15.0)
Hepatitis B Surface Ag: NEGATIVE
LYMPHS ABS: 2 10*3/uL (ref 0.7–4.0)
LYMPHS PCT: 22 % (ref 12–46)
MCH: 31 pg (ref 26.0–34.0)
MCHC: 33.8 g/dL (ref 30.0–36.0)
MCV: 91.8 fL (ref 78.0–100.0)
MONO ABS: 0.5 10*3/uL (ref 0.1–1.0)
MONOS PCT: 5 % (ref 3–12)
Neutro Abs: 6.8 10*3/uL (ref 1.7–7.7)
Neutrophils Relative %: 73 % (ref 43–77)
Platelets: 193 10*3/uL (ref 150–400)
RBC: 4.26 MIL/uL (ref 3.87–5.11)
RDW: 13.3 % (ref 11.5–15.5)
RH TYPE: POSITIVE
RUBELLA: 1.45 {index} — AB (ref <0.90)
WBC: 9.3 10*3/uL (ref 4.0–10.5)

## 2013-03-15 LAB — GC/CHLAMYDIA PROBE AMP
CT Probe RNA: NEGATIVE
GC Probe RNA: NEGATIVE

## 2013-03-15 LAB — HIV ANTIBODY (ROUTINE TESTING W REFLEX): HIV: NONREACTIVE

## 2013-03-17 LAB — CULTURE, URINE COMPREHENSIVE: Colony Count: 60000

## 2013-03-23 ENCOUNTER — Other Ambulatory Visit: Payer: BC Managed Care – PPO

## 2013-03-23 DIAGNOSIS — Z20828 Contact with and (suspected) exposure to other viral communicable diseases: Secondary | ICD-10-CM

## 2013-03-23 NOTE — Progress Notes (Signed)
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

## 2013-03-27 LAB — PARVOVIRUS B19 ANTIBODY, IGG AND IGM
PAROVIRUS B19 IGM ABS: 0.3 {index} (ref <0.9)
Parovirus B19 IgG Abs: 5.6 index — ABNORMAL HIGH (ref <0.9)

## 2013-03-28 ENCOUNTER — Telehealth: Payer: Self-pay | Admitting: *Deleted

## 2013-03-28 NOTE — Telephone Encounter (Signed)
Pt notified of Parvo results.  Spoke with Dr Roselie Awkward who agreed that results showed pt has immunity but not recent exposure.

## 2013-04-11 ENCOUNTER — Ambulatory Visit (INDEPENDENT_AMBULATORY_CARE_PROVIDER_SITE_OTHER): Payer: BC Managed Care – PPO | Admitting: Obstetrics & Gynecology

## 2013-04-11 ENCOUNTER — Encounter: Payer: Self-pay | Admitting: Advanced Practice Midwife

## 2013-04-11 ENCOUNTER — Encounter: Payer: Self-pay | Admitting: Obstetrics & Gynecology

## 2013-04-11 ENCOUNTER — Encounter: Payer: Self-pay | Admitting: *Deleted

## 2013-04-11 VITALS — BP 112/64 | Wt 161.0 lb

## 2013-04-11 DIAGNOSIS — Z3480 Encounter for supervision of other normal pregnancy, unspecified trimester: Secondary | ICD-10-CM

## 2013-04-11 NOTE — Progress Notes (Signed)
P - 67

## 2013-04-11 NOTE — Progress Notes (Signed)
Routine visit. No more bleeding since the occasion last week. She is on pelvic rest. A+. NIPS normal female.

## 2013-05-10 ENCOUNTER — Ambulatory Visit (INDEPENDENT_AMBULATORY_CARE_PROVIDER_SITE_OTHER): Payer: BC Managed Care – PPO | Admitting: Obstetrics & Gynecology

## 2013-05-10 VITALS — BP 132/76 | HR 70 | Wt 164.0 lb

## 2013-05-10 DIAGNOSIS — Z3480 Encounter for supervision of other normal pregnancy, unspecified trimester: Secondary | ICD-10-CM

## 2013-05-10 NOTE — Progress Notes (Signed)
Pt doing well.  Will recheck cbc for plt count.  Anatomy US scheduled.

## 2013-05-11 LAB — CBC
HCT: 37.7 % (ref 36.0–46.0)
Hemoglobin: 13.1 g/dL (ref 12.0–15.0)
MCH: 31.8 pg (ref 26.0–34.0)
MCHC: 34.7 g/dL (ref 30.0–36.0)
MCV: 91.5 fL (ref 78.0–100.0)
PLATELETS: 197 10*3/uL (ref 150–400)
RBC: 4.12 MIL/uL (ref 3.87–5.11)
RDW: 13.5 % (ref 11.5–15.5)
WBC: 10.3 10*3/uL (ref 4.0–10.5)

## 2013-05-22 ENCOUNTER — Ambulatory Visit (HOSPITAL_COMMUNITY)
Admission: RE | Admit: 2013-05-22 | Discharge: 2013-05-22 | Disposition: A | Discharge status: Home / Self Care | Payer: BC Managed Care – PPO | Source: Ambulatory Visit | Attending: Obstetrics & Gynecology | Admitting: Obstetrics & Gynecology

## 2013-05-22 DIAGNOSIS — O358XX0 Maternal care for other (suspected) fetal abnormality and damage, not applicable or unspecified: Secondary | ICD-10-CM | POA: Insufficient documentation

## 2013-05-22 DIAGNOSIS — Z3480 Encounter for supervision of other normal pregnancy, unspecified trimester: Secondary | ICD-10-CM

## 2013-05-22 DIAGNOSIS — Z1389 Encounter for screening for other disorder: Secondary | ICD-10-CM | POA: Insufficient documentation

## 2013-05-22 DIAGNOSIS — Z363 Encounter for antenatal screening for malformations: Secondary | ICD-10-CM | POA: Insufficient documentation

## 2013-05-23 ENCOUNTER — Encounter: Payer: Self-pay | Admitting: Obstetrics & Gynecology

## 2013-05-26 ENCOUNTER — Encounter: Payer: Self-pay | Admitting: Family Medicine

## 2013-06-06 ENCOUNTER — Ambulatory Visit (INDEPENDENT_AMBULATORY_CARE_PROVIDER_SITE_OTHER): Payer: BC Managed Care – PPO | Admitting: Obstetrics & Gynecology

## 2013-06-06 ENCOUNTER — Encounter: Payer: Self-pay | Admitting: Obstetrics & Gynecology

## 2013-06-06 VITALS — BP 139/74 | HR 84 | Wt 170.0 lb

## 2013-06-06 DIAGNOSIS — Z3480 Encounter for supervision of other normal pregnancy, unspecified trimester: Secondary | ICD-10-CM

## 2013-06-06 NOTE — Progress Notes (Signed)
Routine visit. Good FM. No problems. Going to [employer] next week. Has access to My Chart. MSAFP today.

## 2013-06-06 NOTE — Progress Notes (Signed)
Baby kicks very low in pelvic

## 2013-06-07 ENCOUNTER — Encounter: Payer: BC Managed Care – PPO | Admitting: Obstetrics & Gynecology

## 2013-06-08 LAB — ALPHA FETOPROTEIN, MATERNAL
AFP: 62.8 IU/mL
Curr Gest Age: 21.4 wks.days
MoM for AFP: 1.03
OPEN SPINA BIFIDA: NEGATIVE

## 2013-06-12 ENCOUNTER — Encounter: Payer: Self-pay | Admitting: Obstetrics & Gynecology

## 2013-07-04 ENCOUNTER — Encounter: Payer: Self-pay | Admitting: Obstetrics & Gynecology

## 2013-07-04 ENCOUNTER — Ambulatory Visit (INDEPENDENT_AMBULATORY_CARE_PROVIDER_SITE_OTHER): Payer: BC Managed Care – PPO | Admitting: Obstetrics & Gynecology

## 2013-07-04 VITALS — BP 123/73 | HR 76 | Wt 177.0 lb

## 2013-07-04 DIAGNOSIS — Z3480 Encounter for supervision of other normal pregnancy, unspecified trimester: Secondary | ICD-10-CM

## 2013-07-04 NOTE — Progress Notes (Signed)
Routine visit. Good FM. Anatomy follow up ordered. Glucola, tdap, labs at next visit. No problems. [place] was fun!

## 2013-07-05 ENCOUNTER — Ambulatory Visit (HOSPITAL_COMMUNITY)
Admission: RE | Admit: 2013-07-05 | Discharge: 2013-07-05 | Disposition: A | Discharge status: Home / Self Care | Payer: BC Managed Care – PPO | Source: Ambulatory Visit | Attending: Obstetrics & Gynecology | Admitting: Obstetrics & Gynecology

## 2013-07-05 DIAGNOSIS — Z3689 Encounter for other specified antenatal screening: Secondary | ICD-10-CM | POA: Insufficient documentation

## 2013-07-05 DIAGNOSIS — Z3480 Encounter for supervision of other normal pregnancy, unspecified trimester: Secondary | ICD-10-CM

## 2013-07-18 ENCOUNTER — Ambulatory Visit (INDEPENDENT_AMBULATORY_CARE_PROVIDER_SITE_OTHER): Payer: BC Managed Care – PPO | Admitting: Obstetrics & Gynecology

## 2013-07-18 VITALS — BP 125/65 | Wt 179.0 lb

## 2013-07-18 DIAGNOSIS — Z3482 Encounter for supervision of other normal pregnancy, second trimester: Secondary | ICD-10-CM

## 2013-07-18 DIAGNOSIS — Z3480 Encounter for supervision of other normal pregnancy, unspecified trimester: Secondary | ICD-10-CM

## 2013-07-18 DIAGNOSIS — Z23 Encounter for immunization: Secondary | ICD-10-CM

## 2013-07-18 LAB — CBC
HCT: 36.8 % (ref 36.0–46.0)
HEMOGLOBIN: 12.5 g/dL (ref 12.0–15.0)
MCH: 32.3 pg (ref 26.0–34.0)
MCHC: 34 g/dL (ref 30.0–36.0)
MCV: 95.1 fL (ref 78.0–100.0)
Platelets: 172 10*3/uL (ref 150–400)
RBC: 3.87 MIL/uL (ref 3.87–5.11)
RDW: 13.3 % (ref 11.5–15.5)
WBC: 8.8 10*3/uL (ref 4.0–10.5)

## 2013-07-18 MED ORDER — TETANUS-DIPHTH-ACELL PERTUSSIS 5-2.5-18.5 LF-MCG/0.5 IM SUSP
0.5000 mL | Freq: Once | INTRAMUSCULAR | Status: AC
  Administered 2013-07-18: 0.5 mL via INTRAMUSCULAR

## 2013-07-18 NOTE — Progress Notes (Signed)
Nml anatomy follow up.  Discussed weight gain  At 15 pounds.  Goal is 25-35.  Travel to [place].  Walk frequently and wear compression stockings.  GCT and tdap today.

## 2013-07-19 ENCOUNTER — Telehealth: Payer: Self-pay | Admitting: *Deleted

## 2013-07-19 LAB — HIV ANTIBODY (ROUTINE TESTING W REFLEX): HIV 1&2 Ab, 4th Generation: NONREACTIVE

## 2013-07-19 LAB — GLUCOSE TOLERANCE, 1 HOUR (50G) W/O FASTING: GLUCOSE 1 HOUR GTT: 81 mg/dL (ref 70–140)

## 2013-07-19 LAB — RPR

## 2013-07-19 NOTE — Telephone Encounter (Signed)
LM on voicemail of normal 1 hr GTT. 

## 2013-07-24 ENCOUNTER — Encounter: Payer: Self-pay | Admitting: Obstetrics & Gynecology

## 2013-08-08 ENCOUNTER — Ambulatory Visit (INDEPENDENT_AMBULATORY_CARE_PROVIDER_SITE_OTHER): Payer: BC Managed Care – PPO | Admitting: Obstetrics & Gynecology

## 2013-08-08 ENCOUNTER — Inpatient Hospital Stay (HOSPITAL_COMMUNITY): Payer: BC Managed Care – PPO

## 2013-08-08 ENCOUNTER — Encounter (HOSPITAL_COMMUNITY): Payer: Self-pay | Admitting: *Deleted

## 2013-08-08 ENCOUNTER — Inpatient Hospital Stay (HOSPITAL_COMMUNITY)
Admission: AD | Admit: 2013-08-08 | Discharge: 2013-08-08 | Disposition: A | Discharge status: Home / Self Care | Payer: BC Managed Care – PPO | Source: Ambulatory Visit | Attending: Obstetrics & Gynecology | Admitting: Obstetrics & Gynecology

## 2013-08-08 VITALS — BP 118/65 | HR 80 | Wt 184.0 lb

## 2013-08-08 DIAGNOSIS — O212 Late vomiting of pregnancy: Secondary | ICD-10-CM | POA: Insufficient documentation

## 2013-08-08 DIAGNOSIS — O99119 Other diseases of the blood and blood-forming organs and certain disorders involving the immune mechanism complicating pregnancy, unspecified trimester: Principal | ICD-10-CM

## 2013-08-08 DIAGNOSIS — O368390 Maternal care for abnormalities of the fetal heart rate or rhythm, unspecified trimester, not applicable or unspecified: Secondary | ICD-10-CM

## 2013-08-08 DIAGNOSIS — D696 Thrombocytopenia, unspecified: Secondary | ICD-10-CM | POA: Insufficient documentation

## 2013-08-08 DIAGNOSIS — O0913 Supervision of pregnancy with history of ectopic or molar pregnancy, third trimester: Secondary | ICD-10-CM

## 2013-08-08 DIAGNOSIS — O09A0 Supervision of pregnancy with history of molar pregnancy, unspecified trimester: Secondary | ICD-10-CM | POA: Insufficient documentation

## 2013-08-08 DIAGNOSIS — O99113 Other diseases of the blood and blood-forming organs and certain disorders involving the immune mechanism complicating pregnancy, third trimester: Secondary | ICD-10-CM

## 2013-08-08 DIAGNOSIS — Z3480 Encounter for supervision of other normal pregnancy, unspecified trimester: Secondary | ICD-10-CM

## 2013-08-08 DIAGNOSIS — D689 Coagulation defect, unspecified: Secondary | ICD-10-CM | POA: Insufficient documentation

## 2013-08-08 DIAGNOSIS — Z3483 Encounter for supervision of other normal pregnancy, third trimester: Secondary | ICD-10-CM

## 2013-08-08 LAB — URINALYSIS, ROUTINE W REFLEX MICROSCOPIC
BILIRUBIN URINE: NEGATIVE
Glucose, UA: NEGATIVE mg/dL
Ketones, ur: NEGATIVE mg/dL
Nitrite: NEGATIVE
PROTEIN: NEGATIVE mg/dL
Specific Gravity, Urine: 1.01 (ref 1.005–1.030)
UROBILINOGEN UA: 0.2 mg/dL (ref 0.0–1.0)
pH: 7 (ref 5.0–8.0)

## 2013-08-08 LAB — COMPREHENSIVE METABOLIC PANEL
ALBUMIN: 3.1 g/dL — AB (ref 3.5–5.2)
ALK PHOS: 67 U/L (ref 39–117)
ALT: 12 U/L (ref 0–35)
AST: 17 U/L (ref 0–37)
Anion gap: 13 (ref 5–15)
BILIRUBIN TOTAL: 0.3 mg/dL (ref 0.3–1.2)
BUN: 5 mg/dL — ABNORMAL LOW (ref 6–23)
CHLORIDE: 103 meq/L (ref 96–112)
CO2: 24 mEq/L (ref 19–32)
Calcium: 9.3 mg/dL (ref 8.4–10.5)
Creatinine, Ser: 0.47 mg/dL — ABNORMAL LOW (ref 0.50–1.10)
GFR calc Af Amer: >90 mL/min (ref >90)
GFR calc non Af Amer: >90 mL/min (ref >90)
Glucose, Bld: 81 mg/dL (ref 70–99)
Potassium: 4 mEq/L (ref 3.7–5.3)
SODIUM: 140 meq/L (ref 137–147)
TOTAL PROTEIN: 6.5 g/dL (ref 6.0–8.3)

## 2013-08-08 LAB — CBC WITH DIFFERENTIAL/PLATELET
BASOS PCT: 0 % (ref 0–1)
Basophils Absolute: 0 10*3/uL (ref 0.0–0.1)
EOS ABS: 0 10*3/uL (ref 0.0–0.7)
Eosinophils Relative: 0 % (ref 0–5)
HEMATOCRIT: 36.8 % (ref 36.0–46.0)
HEMOGLOBIN: 13 g/dL (ref 12.0–15.0)
Lymphocytes Relative: 17 % (ref 12–46)
Lymphs Abs: 1.8 10*3/uL (ref 0.7–4.0)
MCH: 33.6 pg (ref 26.0–34.0)
MCHC: 35.3 g/dL (ref 30.0–36.0)
MCV: 95.1 fL (ref 78.0–100.0)
MONO ABS: 0.6 10*3/uL (ref 0.1–1.0)
MONOS PCT: 6 % (ref 3–12)
Neutro Abs: 8.3 10*3/uL — ABNORMAL HIGH (ref 1.7–7.7)
Neutrophils Relative %: 77 % (ref 43–77)
Platelets: 129 10*3/uL — ABNORMAL LOW (ref 150–400)
RBC: 3.87 MIL/uL (ref 3.87–5.11)
RDW: 13 % (ref 11.5–15.5)
WBC: 10.7 10*3/uL — ABNORMAL HIGH (ref 4.0–10.5)

## 2013-08-08 LAB — URINE MICROSCOPIC-ADD ON

## 2013-08-08 MED ORDER — ONDANSETRON 8 MG/NS 50 ML IVPB
8.0000 mg | Freq: Four times a day (QID) | INTRAVENOUS | Status: DC | PRN
Start: 2013-08-08 — End: 2013-08-08
  Filled 2013-08-08: qty 8

## 2013-08-08 MED ORDER — LACTATED RINGERS IV SOLN
500.0000 mL | Freq: Once | INTRAVENOUS | Status: AC
  Administered 2013-08-08: 500 mL via INTRAVENOUS

## 2013-08-08 MED ORDER — PROMETHAZINE HCL 25 MG/ML IJ SOLN: 25.0000 mg | Freq: Four times a day (QID) | INTRAMUSCULAR | Status: DC | PRN

## 2013-08-08 NOTE — MAU Provider Note (Signed)
Chief Complaint:  Tachycardia   First Provider Initiated Contact with Patient 08/08/13 1354      HPI: Danielle Vincent is a 33 y.o. [id number] at [redacted]w[redacted]d who presents to maternity admissions from [facility] due to finding of  Fetal tachycardia. On hand-held doppler, noted to have FHR of 180s, therefore received NST which was reactive, but showed baseline FHR of 170s. Due to this, she was sent to MAU for BPP and further eval.  Of note, pt reports increased nausea and vomiting for past 3-4 days, but denies fevers, chills, dysuria, flank pain, or increased ferquency.  Denies contractions, leakage of fluid or vaginal bleeding. Good fetal movement.   Pregnancy Course:  Complicated by gestational thrombocytopenia, h/o molar pregnancy  Past Medical History: Past Medical History  Diagnosis Date  . Supervision of other normal pregnancy 10/04/2011     Genetic Screen Neg [place], XX Anatomic [place] Normal female Glucose Screen  GBS  Feeding Preference Breast Contraception  Circumcision n/a    . Molar pregnancy 2012  . Positive PPD, treated     Past obstetric history: OB History  Gravida Para Term Preterm AB SAB TAB Ectopic Multiple Living  4 2 2  1 1    2     # Outcome Date GA Lbr Len/2nd Weight Sex Delivery Anes PTL Lv  4 CUR           3 TRM 12/31/11 [redacted]w[redacted]d 01:06 / 00:12 7 lb 4.4 oz (3.3 kg) F SVD EPI  Y     Comments: na  2 TRM 02/11/09 [redacted]w[redacted]d  7 lb 13 oz (3.544 kg) F SVD EPI  Y  1 SAB               Past Surgical History: Past Surgical History  Procedure Laterality Date  . Knee surgery  1995  . Dilation and curettage of uterus       Family History: Family History  Problem Relation Age of Onset  . Cancer Maternal Grandmother     lung  . Diabetes Paternal Grandmother   . Diabetes Paternal Grandfather   . Diabetes Maternal Grandmother   . Hyperlipidemia Mother   . Hyperlipidemia Maternal Grandmother   . Osteoporosis Maternal Grandmother     Social History: History   Substance Use Topics  . Smoking status: Never Smoker   . Smokeless tobacco: Never Used  . Alcohol Use: No    Allergies: No Known Allergies  Meds:  Prescriptions prior to admission  Medication Sig Dispense Refill  . Prenatal Vit-Fe Fumarate-FA (PRENATAL MULTIVITAMIN) TABS Take 1 tablet by mouth at bedtime.        ROS: Pertinent findings in history of present illness.  Physical Exam  Blood pressure 119/68, pulse 76, temperature 99.2 F (37.3 C), temperature source Oral, resp. rate 16, height 5' 9.5" (1.765 m), weight 183 lb 6.4 oz (83.19 kg), last menstrual period 01/01/2013, SpO2 100.00%, currently breastfeeding. GENERAL: Well-developed, well-nourished female in no acute distress.  HEENT: normocephalic HEART: normal rate RESP: normal effort ABDOMEN: Soft, non-tender, gravid appropriate for gestational age EXTREMITIES: Nontender, no edema NEURO: alert and oriented SPECULUM EXAM: NEFG, physiologic discharge, no blood, cervix clean    FHT:  Baseline 140 , moderate variability, accelerations present, no decelerations Contractions: absent   Labs: Results for orders placed during the hospital encounter of 08/08/13 (from the past 24 hour(s))  COMPREHENSIVE METABOLIC PANEL     Status: Abnormal   Collection Time    08/08/13 12:45 PM  Result Value Ref Range   Sodium 140  137 - 147 mEq/L   Potassium 4.0  3.7 - 5.3 mEq/L   Chloride 103  96 - 112 mEq/L   CO2 24  19 - 32 mEq/L   Glucose, Bld 81  70 - 99 mg/dL   BUN 5 (*) 6 - 23 mg/dL   Creatinine, Ser 0.47 (*) 0.50 - 1.10 mg/dL   Calcium 9.3  8.4 - 10.5 mg/dL   Total Protein 6.5  6.0 - 8.3 g/dL   Albumin 3.1 (*) 3.5 - 5.2 g/dL   AST 17  0 - 37 U/L   ALT 12  0 - 35 U/L   Alkaline Phosphatase 67  39 - 117 U/L   Total Bilirubin 0.3  0.3 - 1.2 mg/dL   GFR calc non Af Amer >90  >90 mL/min   GFR calc Af Amer >90  >90 mL/min   Anion gap 13  5 - 15  CBC WITH DIFFERENTIAL     Status: Abnormal   Collection Time    08/08/13 12:45  PM      Result Value Ref Range   WBC 10.7 (*) 4.0 - 10.5 K/uL   RBC 3.87  3.87 - 5.11 MIL/uL   Hemoglobin 13.0  12.0 - 15.0 g/dL   HCT 36.8  36.0 - 46.0 %   MCV 95.1  78.0 - 100.0 fL   MCH 33.6  26.0 - 34.0 pg   MCHC 35.3  30.0 - 36.0 g/dL   RDW 13.0  11.5 - 15.5 %   Platelets 129 (*) 150 - 400 K/uL   Neutrophils Relative % 77  43 - 77 %   Neutro Abs 8.3 (*) 1.7 - 7.7 K/uL   Lymphocytes Relative 17  12 - 46 %   Lymphs Abs 1.8  0.7 - 4.0 K/uL   Monocytes Relative 6  3 - 12 %   Monocytes Absolute 0.6  0.1 - 1.0 K/uL   Eosinophils Relative 0  0 - 5 %   Eosinophils Absolute 0.0  0.0 - 0.7 K/uL   Basophils Relative 0  0 - 1 %   Basophils Absolute 0.0  0.0 - 0.1 K/uL    Imaging:  US Fetal Bpp Wo Non Stress  08/08/2013   OBSTETRICAL ULTRASOUND: This exam was performed within a [facility] Department. The OB US report was generated in the AS system, and faxed to the ordering physician.   This report is available in the [facility]. See the [facility] Obstetric US report via the Image Link.  Results: 8/8  MAU Course: Pt observed in MAU for 2 hours with reactive NST w/ FHR in 140s during admission. She received a 500 cc LR bolus and denied any ongoing nausea or vomiting and thus did not receive any anti-emetics. CMP and CBC grossly WNL with WBC of 10.7 and platelets of 129. U/A w/o evidence of infection. She received a BPP which was 8/8. Given stability and resolved fetal tachycardia, she was discharged home.   Assessment: 1. Thrombocytopenia complicating pregnancy, third trimester     Plan: Discharge home Labor precautions and fetal kick counts F/up in clinic on 7/31 @ 10:45    Medication List    ASK your doctor about these medications       prenatal multivitamin Tabs tablet  Take 1 tablet by mouth at bedtime.        Josephine Cables, MD 08/08/2013 2:07 PM

## 2013-08-08 NOTE — MAU Provider Note (Signed)
Attestation of Attending Supervision of Advanced Practitioner (PA/CNM/NP): Evaluation and management procedures were performed by the Advanced Practitioner under my supervision and collaboration.  I have reviewed the Advanced Practitioner's note and chart, and I agree with the management and plan.  Ainsleigh Kakos, MD, FACOG Attending Obstetrician & Gynecologist Faculty Practice, [facility]   

## 2013-08-08 NOTE — MAU Note (Signed)
Patient aware of antiemetics ordered PRN; at this time states does not feel like needs them.

## 2013-08-08 NOTE — Progress Notes (Signed)
Pt c/s 3 days of nausea nad feeling bad.  No fever.  Baby tachy with 170 baseline.  Reactive with no decelerations.  Will send to mau for prolonged monitoring and bpp and fluids.

## 2013-08-08 NOTE — MAU Note (Signed)
Patient states she was seen for her regular appointment this am. Fetal tachycardia was evaluated with monitoring and patient was sent to MAU for further monitoring. Patient denies bleeding, leaking, has mild contractions irregular, nausea vomiting or diarrhea.

## 2013-08-15 ENCOUNTER — Encounter: Payer: BC Managed Care – PPO | Admitting: Obstetrics & Gynecology

## 2013-08-21 ENCOUNTER — Ambulatory Visit (INDEPENDENT_AMBULATORY_CARE_PROVIDER_SITE_OTHER): Payer: BC Managed Care – PPO | Admitting: Obstetrics & Gynecology

## 2013-08-21 VITALS — BP 131/80 | HR 75 | Wt 189.0 lb

## 2013-08-21 DIAGNOSIS — Z3493 Encounter for supervision of normal pregnancy, unspecified, third trimester: Secondary | ICD-10-CM

## 2013-08-21 DIAGNOSIS — Z3480 Encounter for supervision of other normal pregnancy, unspecified trimester: Secondary | ICD-10-CM

## 2013-08-21 DIAGNOSIS — R110 Nausea: Secondary | ICD-10-CM

## 2013-08-21 NOTE — Progress Notes (Signed)
Pt has new onset nausea.  Suggested it could be reflux sym;ptoms and to try tums and zantac.  Pt does not desire an antiemetic at this time.  Pt will call if she does.   Pt has low back pain radiating into right hip   Suggested pregnancy belt and prenatal massage.

## 2013-08-21 NOTE — Progress Notes (Signed)
Pt in today due to feeling nauseated. Sx started today, not relieved by eating. Pt has been drinking more water to stay hydrated but has not helped with 'sick feeling'

## 2013-08-22 ENCOUNTER — Encounter: Payer: BC Managed Care – PPO | Admitting: Obstetrics & Gynecology

## 2013-08-31 ENCOUNTER — Ambulatory Visit (INDEPENDENT_AMBULATORY_CARE_PROVIDER_SITE_OTHER): Payer: BC Managed Care – PPO | Admitting: Obstetrics and Gynecology

## 2013-08-31 ENCOUNTER — Encounter: Payer: Self-pay | Admitting: Obstetrics and Gynecology

## 2013-08-31 VITALS — BP 127/68 | HR 64 | Wt 191.0 lb

## 2013-08-31 DIAGNOSIS — Z3480 Encounter for supervision of other normal pregnancy, unspecified trimester: Secondary | ICD-10-CM

## 2013-08-31 DIAGNOSIS — Z3493 Encounter for supervision of normal pregnancy, unspecified, third trimester: Secondary | ICD-10-CM

## 2013-08-31 NOTE — Progress Notes (Signed)
Again reviewed LBP and relief measures. Requests cx check due to traveling in a few days and hx PTCD last pregnancy. No UCs. No other concerns.

## 2013-08-31 NOTE — Patient Instructions (Signed)
Third Trimester of Pregnancy The third trimester is from week 29 through week 42, months 7 through 9. The third trimester is a time when the fetus is growing rapidly. At the end of the ninth month, the fetus is about 20 inches in length and weighs 6-10 pounds.  BODY CHANGES Your body goes through many changes during pregnancy. The changes vary from woman to woman.   Your weight will continue to increase. You can expect to gain 25-35 pounds (11-16 kg) by the end of the pregnancy.  You may begin to get stretch marks on your hips, abdomen, and breasts.  You may urinate more often because the fetus is moving lower into your pelvis and pressing on your bladder.  You may develop or continue to have heartburn as a result of your pregnancy.  You may develop constipation because certain hormones are causing the muscles that push waste through your intestines to slow down.  You may develop hemorrhoids or swollen, bulging veins (varicose veins).  You may have pelvic pain because of the weight gain and pregnancy hormones relaxing your joints between the bones in your pelvis. Backaches may result from overexertion of the muscles supporting your posture.  You may have changes in your hair. These can include thickening of your hair, rapid growth, and changes in texture. Some women also have hair loss during or after pregnancy, or hair that feels dry or thin. Your hair will most likely return to normal after your baby is born.  Your breasts will continue to grow and be tender. A yellow discharge may leak from your breasts called colostrum.  Your belly button may stick out.  You may feel short of breath because of your expanding uterus.  You may notice the fetus "dropping," or moving lower in your abdomen.  You may have a bloody mucus discharge. This usually occurs a few days to a week before labor begins.  Your cervix becomes thin and soft (effaced) near your due date. WHAT TO EXPECT AT YOUR PRENATAL  EXAMS  You will have prenatal exams every 2 weeks until week 36. Then, you will have weekly prenatal exams. During a routine prenatal visit:  You will be weighed to make sure you and the fetus are growing normally.  Your blood pressure is taken.  Your abdomen will be measured to track your baby's growth.  The fetal heartbeat will be listened to.  Any test results from the previous visit will be discussed.  You may have a cervical check near your due date to see if you have effaced. At around 36 weeks, your caregiver will check your cervix. At the same time, your caregiver will also perform a test on the secretions of the vaginal tissue. This test is to determine if a type of bacteria, Group B streptococcus, is present. Your caregiver will explain this further. Your caregiver may ask you:  What your birth plan is.  How you are feeling.  If you are feeling the baby move.  If you have had any abnormal symptoms, such as leaking fluid, bleeding, severe headaches, or abdominal cramping.  If you have any questions. Other tests or screenings that may be performed during your third trimester include:  Blood tests that check for low iron levels (anemia).  Fetal testing to check the health, activity level, and growth of the fetus. Testing is done if you have certain medical conditions or if there are problems during the pregnancy. FALSE LABOR You may feel small, irregular contractions that   eventually go away. These are called Braxton Hicks contractions, or false labor. Contractions may last for hours, days, or even weeks before true labor sets in. If contractions come at regular intervals, intensify, or become painful, it is best to be seen by your caregiver.  SIGNS OF LABOR   Menstrual-like cramps.  Contractions that are 5 minutes apart or less.  Contractions that start on the top of the uterus and spread down to the lower abdomen and back.  A sense of increased pelvic pressure or back  pain.  A watery or bloody mucus discharge that comes from the vagina. If you have any of these signs before the 37th week of pregnancy, call your caregiver right away. You need to go to the hospital to get checked immediately. HOME CARE INSTRUCTIONS   Avoid all smoking, herbs, alcohol, and unprescribed drugs. These chemicals affect the formation and growth of the baby.  Follow your caregiver's instructions regarding medicine use. There are medicines that are either safe or unsafe to take during pregnancy.  Exercise only as directed by your caregiver. Experiencing uterine cramps is a good sign to stop exercising.  Continue to eat regular, healthy meals.  Wear a good support bra for breast tenderness.  Do not use hot tubs, steam rooms, or saunas.  Wear your seat belt at all times when driving.  Avoid raw meat, uncooked cheese, cat litter boxes, and soil used by cats. These carry germs that can cause birth defects in the baby.  Take your prenatal vitamins.  Try taking a stool softener (if your caregiver approves) if you develop constipation. Eat more high-fiber foods, such as fresh vegetables or fruit and whole grains. Drink plenty of fluids to keep your urine clear or pale yellow.  Take warm sitz baths to soothe any pain or discomfort caused by hemorrhoids. Use hemorrhoid cream if your caregiver approves.  If you develop varicose veins, wear support hose. Elevate your feet for 15 minutes, 3-4 times a day. Limit salt in your diet.  Avoid heavy lifting, wear low heal shoes, and practice good posture.  Rest a lot with your legs elevated if you have leg cramps or low back pain.  Visit your dentist if you have not gone during your pregnancy. Use a soft toothbrush to brush your teeth and be gentle when you floss.  A sexual relationship may be continued unless your caregiver directs you otherwise.  Do not travel far distances unless it is absolutely necessary and only with the approval  of your caregiver.  Take prenatal classes to understand, practice, and ask questions about the labor and delivery.  Make a trial run to the hospital.  Pack your hospital bag.  Prepare the baby's nursery.  Continue to go to all your prenatal visits as directed by your caregiver. SEEK MEDICAL CARE IF:  You are unsure if you are in labor or if your water has broken.  You have dizziness.  You have mild pelvic cramps, pelvic pressure, or nagging pain in your abdominal area.  You have persistent nausea, vomiting, or diarrhea.  You have a bad smelling vaginal discharge.  You have pain with urination. SEEK IMMEDIATE MEDICAL CARE IF:   You have a fever.  You are leaking fluid from your vagina.  You have spotting or bleeding from your vagina.  You have severe abdominal cramping or pain.  You have rapid weight loss or gain.  You have shortness of breath with chest pain.  You notice sudden or extreme swelling   of your face, hands, ankles, feet, or legs.  You have not felt your baby move in over an hour.  You have severe headaches that do not go away with medicine.  You have vision changes. Document Released: 12/29/2000 Document Revised: 01/09/2013 Document Reviewed: 03/07/2012 Exit[employer] Patient Information 2015 [employer]. This information is not intended to replace advice given to you by your health care provider. Make sure you discuss any questions you have with your health care provider.  

## 2013-09-12 ENCOUNTER — Encounter: Payer: Self-pay | Admitting: Obstetrics & Gynecology

## 2013-09-12 ENCOUNTER — Ambulatory Visit (INDEPENDENT_AMBULATORY_CARE_PROVIDER_SITE_OTHER): Payer: BC Managed Care – PPO | Admitting: Obstetrics & Gynecology

## 2013-09-12 VITALS — BP 117/69 | HR 75 | Wt 187.0 lb

## 2013-09-12 DIAGNOSIS — Z3483 Encounter for supervision of other normal pregnancy, third trimester: Secondary | ICD-10-CM

## 2013-09-12 DIAGNOSIS — Z3480 Encounter for supervision of other normal pregnancy, unspecified trimester: Secondary | ICD-10-CM

## 2013-09-12 NOTE — Progress Notes (Signed)
Routine visit. Good FM. No problems. Cervical cultures next week

## 2013-09-20 ENCOUNTER — Ambulatory Visit (INDEPENDENT_AMBULATORY_CARE_PROVIDER_SITE_OTHER): Payer: BC Managed Care – PPO | Admitting: Obstetrics & Gynecology

## 2013-09-20 VITALS — BP 116/67 | HR 85 | Wt 190.0 lb

## 2013-09-20 DIAGNOSIS — Z113 Encounter for screening for infections with a predominantly sexual mode of transmission: Secondary | ICD-10-CM | POA: Diagnosis not present

## 2013-09-20 DIAGNOSIS — Z3483 Encounter for supervision of other normal pregnancy, third trimester: Secondary | ICD-10-CM

## 2013-09-20 DIAGNOSIS — Z3480 Encounter for supervision of other normal pregnancy, unspecified trimester: Secondary | ICD-10-CM

## 2013-09-20 DIAGNOSIS — Z3685 Encounter for antenatal screening for Streptococcus B: Secondary | ICD-10-CM | POA: Diagnosis not present

## 2013-09-20 LAB — OB RESULTS CONSOLE GBS: STREP GROUP B AG: NEGATIVE

## 2013-09-20 LAB — OB RESULTS CONSOLE GC/CHLAMYDIA
Chlamydia: NEGATIVE
Gonorrhea: NEGATIVE

## 2013-09-20 NOTE — Progress Notes (Signed)
Cultures today.  Pt is upset from bothersome phone call she took just before BP taken.   Pt denies severe headache, swelling, scotomata, RUQ pain.  Rpt BP is 116/67 .

## 2013-09-21 LAB — GC/CHLAMYDIA PROBE AMP
CT PROBE, AMP APTIMA: NEGATIVE
GC PROBE AMP APTIMA: NEGATIVE

## 2013-09-23 ENCOUNTER — Encounter: Payer: Self-pay | Admitting: Obstetrics & Gynecology

## 2013-09-23 LAB — CULTURE, BETA STREP (GROUP B ONLY)

## 2013-09-28 ENCOUNTER — Ambulatory Visit (INDEPENDENT_AMBULATORY_CARE_PROVIDER_SITE_OTHER): Payer: BC Managed Care – PPO | Admitting: Family

## 2013-09-28 VITALS — BP 129/86 | HR 68 | Wt 193.0 lb

## 2013-09-28 DIAGNOSIS — Z3480 Encounter for supervision of other normal pregnancy, unspecified trimester: Secondary | ICD-10-CM

## 2013-09-28 DIAGNOSIS — Z23 Encounter for immunization: Secondary | ICD-10-CM | POA: Diagnosis not present

## 2013-09-28 MED ORDER — INFLUENZA VAC SPLIT QUAD 0.5 ML IM SUSY
0.5000 mL | PREFILLED_SYRINGE | Freq: Once | INTRAMUSCULAR | Status: AC
  Administered 2013-09-28: 0.5 mL via INTRAMUSCULAR

## 2013-09-28 NOTE — Progress Notes (Signed)
Doing well; reviewed when she should head to hospital.

## 2013-10-05 ENCOUNTER — Encounter: Payer: Self-pay | Admitting: Obstetrics and Gynecology

## 2013-10-05 ENCOUNTER — Ambulatory Visit (INDEPENDENT_AMBULATORY_CARE_PROVIDER_SITE_OTHER): Payer: BC Managed Care – PPO | Admitting: Obstetrics and Gynecology

## 2013-10-05 VITALS — BP 130/74 | HR 86 | Wt 194.0 lb

## 2013-10-05 DIAGNOSIS — Z3483 Encounter for supervision of other normal pregnancy, third trimester: Secondary | ICD-10-CM

## 2013-10-05 DIAGNOSIS — Z3480 Encounter for supervision of other normal pregnancy, unspecified trimester: Secondary | ICD-10-CM

## 2013-10-05 NOTE — Progress Notes (Signed)
Doing well. No concerns. Dating criteria reviewed: had office CRL c/w 9w (5d variation), per MFM 20 wk scan BEGA by LMP> EDD 9/21. Will review with pt certainty of LMP and cycle interval next visit.

## 2013-10-05 NOTE — Patient Instructions (Signed)
Third Trimester of Pregnancy The third trimester is from week 29 through week 42, months 7 through 9. The third trimester is a time when the fetus is growing rapidly. At the end of the ninth month, the fetus is about 20 inches in length and weighs 6-10 pounds.  BODY CHANGES Your body goes through many changes during pregnancy. The changes vary from woman to woman.   Your weight will continue to increase. You can expect to gain 25-35 pounds (11-16 kg) by the end of the pregnancy.  You may begin to get stretch marks on your hips, abdomen, and breasts.  You may urinate more often because the fetus is moving lower into your pelvis and pressing on your bladder.  You may develop or continue to have heartburn as a result of your pregnancy.  You may develop constipation because certain hormones are causing the muscles that push waste through your intestines to slow down.  You may develop hemorrhoids or swollen, bulging veins (varicose veins).  You may have pelvic pain because of the weight gain and pregnancy hormones relaxing your joints between the bones in your pelvis. Backaches may result from overexertion of the muscles supporting your posture.  You may have changes in your hair. These can include thickening of your hair, rapid growth, and changes in texture. Some women also have hair loss during or after pregnancy, or hair that feels dry or thin. Your hair will most likely return to normal after your baby is born.  Your breasts will continue to grow and be tender. A yellow discharge may leak from your breasts called colostrum.  Your belly button may stick out.  You may feel short of breath because of your expanding uterus.  You may notice the fetus "dropping," or moving lower in your abdomen.  You may have a bloody mucus discharge. This usually occurs a few days to a week before labor begins.  Your cervix becomes thin and soft (effaced) near your due date. WHAT TO EXPECT AT YOUR PRENATAL  EXAMS  You will have prenatal exams every 2 weeks until week 36. Then, you will have weekly prenatal exams. During a routine prenatal visit:  You will be weighed to make sure you and the fetus are growing normally.  Your blood pressure is taken.  Your abdomen will be measured to track your baby's growth.  The fetal heartbeat will be listened to.  Any test results from the previous visit will be discussed.  You may have a cervical check near your due date to see if you have effaced. At around 36 weeks, your caregiver will check your cervix. At the same time, your caregiver will also perform a test on the secretions of the vaginal tissue. This test is to determine if a type of bacteria, Group B streptococcus, is present. Your caregiver will explain this further. Your caregiver may ask you:  What your birth plan is.  How you are feeling.  If you are feeling the baby move.  If you have had any abnormal symptoms, such as leaking fluid, bleeding, severe headaches, or abdominal cramping.  If you have any questions. Other tests or screenings that may be performed during your third trimester include:  Blood tests that check for low iron levels (anemia).  Fetal testing to check the health, activity level, and growth of the fetus. Testing is done if you have certain medical conditions or if there are problems during the pregnancy. FALSE LABOR You may feel small, irregular contractions that   eventually go away. These are called Braxton Hicks contractions, or false labor. Contractions may last for hours, days, or even weeks before true labor sets in. If contractions come at regular intervals, intensify, or become painful, it is best to be seen by your caregiver.  SIGNS OF LABOR   Menstrual-like cramps.  Contractions that are 5 minutes apart or less.  Contractions that start on the top of the uterus and spread down to the lower abdomen and back.  A sense of increased pelvic pressure or back  pain.  A watery or bloody mucus discharge that comes from the vagina. If you have any of these signs before the 37th week of pregnancy, call your caregiver right away. You need to go to the hospital to get checked immediately. HOME CARE INSTRUCTIONS   Avoid all smoking, herbs, alcohol, and unprescribed drugs. These chemicals affect the formation and growth of the baby.  Follow your caregiver's instructions regarding medicine use. There are medicines that are either safe or unsafe to take during pregnancy.  Exercise only as directed by your caregiver. Experiencing uterine cramps is a good sign to stop exercising.  Continue to eat regular, healthy meals.  Wear a good support bra for breast tenderness.  Do not use hot tubs, steam rooms, or saunas.  Wear your seat belt at all times when driving.  Avoid raw meat, uncooked cheese, cat litter boxes, and soil used by cats. These carry germs that can cause birth defects in the baby.  Take your prenatal vitamins.  Try taking a stool softener (if your caregiver approves) if you develop constipation. Eat more high-fiber foods, such as fresh vegetables or fruit and whole grains. Drink plenty of fluids to keep your urine clear or pale yellow.  Take warm sitz baths to soothe any pain or discomfort caused by hemorrhoids. Use hemorrhoid cream if your caregiver approves.  If you develop varicose veins, wear support hose. Elevate your feet for 15 minutes, 3-4 times a day. Limit salt in your diet.  Avoid heavy lifting, wear low heal shoes, and practice good posture.  Rest a lot with your legs elevated if you have leg cramps or low back pain.  Visit your dentist if you have not gone during your pregnancy. Use a soft toothbrush to brush your teeth and be gentle when you floss.  A sexual relationship may be continued unless your caregiver directs you otherwise.  Do not travel far distances unless it is absolutely necessary and only with the approval  of your caregiver.  Take prenatal classes to understand, practice, and ask questions about the labor and delivery.  Make a trial run to the hospital.  Pack your hospital bag.  Prepare the baby's nursery.  Continue to go to all your prenatal visits as directed by your caregiver. SEEK MEDICAL CARE IF:  You are unsure if you are in labor or if your water has broken.  You have dizziness.  You have mild pelvic cramps, pelvic pressure, or nagging pain in your abdominal area.  You have persistent nausea, vomiting, or diarrhea.  You have a bad smelling vaginal discharge.  You have pain with urination. SEEK IMMEDIATE MEDICAL CARE IF:   You have a fever.  You are leaking fluid from your vagina.  You have spotting or bleeding from your vagina.  You have severe abdominal cramping or pain.  You have rapid weight loss or gain.  You have shortness of breath with chest pain.  You notice sudden or extreme swelling   of your face, hands, ankles, feet, or legs.  You have not felt your baby move in over an hour.  You have severe headaches that do not go away with medicine.  You have vision changes. Document Released: 12/29/2000 Document Revised: 01/09/2013 Document Reviewed: 03/07/2012 Exit[employer] Patient Information 2015 [employer]. This information is not intended to replace advice given to you by your health care provider. Make sure you discuss any questions you have with your health care provider.  

## 2013-10-11 ENCOUNTER — Ambulatory Visit (INDEPENDENT_AMBULATORY_CARE_PROVIDER_SITE_OTHER): Payer: BC Managed Care – PPO | Admitting: Obstetrics & Gynecology

## 2013-10-11 ENCOUNTER — Telehealth (HOSPITAL_COMMUNITY): Payer: Self-pay | Admitting: *Deleted

## 2013-10-11 VITALS — BP 128/75 | HR 65 | Wt 195.0 lb

## 2013-10-11 DIAGNOSIS — Z3480 Encounter for supervision of other normal pregnancy, unspecified trimester: Secondary | ICD-10-CM

## 2013-10-11 DIAGNOSIS — Z3483 Encounter for supervision of other normal pregnancy, third trimester: Secondary | ICD-10-CM

## 2013-10-11 NOTE — Progress Notes (Signed)
Pt has history of rapid labors  Advance cervical dilation.  Will induce on Saturday at 40 weeks.

## 2013-10-11 NOTE — Telephone Encounter (Signed)
Preadmission screen  

## 2013-10-13 ENCOUNTER — Encounter (HOSPITAL_COMMUNITY): Payer: Self-pay

## 2013-10-13 ENCOUNTER — Encounter (HOSPITAL_COMMUNITY): Payer: BC Managed Care – PPO | Admitting: Anesthesiology

## 2013-10-13 ENCOUNTER — Observation Stay (HOSPITAL_COMMUNITY): Payer: BC Managed Care – PPO | Admitting: Anesthesiology

## 2013-10-13 ENCOUNTER — Inpatient Hospital Stay (HOSPITAL_COMMUNITY)
Admission: RE | Admit: 2013-10-13 | Discharge: 2013-10-16 | DRG: 775 | Disposition: A | Discharge status: Home / Self Care | Payer: BC Managed Care – PPO | Source: Ambulatory Visit | Attending: Obstetrics & Gynecology | Admitting: Obstetrics & Gynecology

## 2013-10-13 VITALS — BP 120/77 | HR 63 | Temp 98.4°F | Resp 20 | Ht 70.0 in | Wt 195.0 lb

## 2013-10-13 DIAGNOSIS — Z3483 Encounter for supervision of other normal pregnancy, third trimester: Secondary | ICD-10-CM

## 2013-10-13 DIAGNOSIS — Z833 Family history of diabetes mellitus: Secondary | ICD-10-CM

## 2013-10-13 DIAGNOSIS — O99113 Other diseases of the blood and blood-forming organs and certain disorders involving the immune mechanism complicating pregnancy, third trimester: Secondary | ICD-10-CM

## 2013-10-13 DIAGNOSIS — D696 Thrombocytopenia, unspecified: Secondary | ICD-10-CM

## 2013-10-13 DIAGNOSIS — Z8262 Family history of osteoporosis: Secondary | ICD-10-CM

## 2013-10-13 DIAGNOSIS — Z3490 Encounter for supervision of normal pregnancy, unspecified, unspecified trimester: Secondary | ICD-10-CM

## 2013-10-13 DIAGNOSIS — Z3493 Encounter for supervision of normal pregnancy, unspecified, third trimester: Secondary | ICD-10-CM

## 2013-10-13 HISTORY — DX: Thrombocytopenia, unspecified: O99.119

## 2013-10-13 HISTORY — DX: Thrombocytopenia, unspecified: D69.6

## 2013-10-13 LAB — TYPE AND SCREEN
ABO/RH(D): A POS
Antibody Screen: NEGATIVE

## 2013-10-13 LAB — CBC
HCT: 37.3 % (ref 36.0–46.0)
HEMOGLOBIN: 13.1 g/dL (ref 12.0–15.0)
MCH: 33 pg (ref 26.0–34.0)
MCHC: 35.1 g/dL (ref 30.0–36.0)
MCV: 94 fL (ref 78.0–100.0)
Platelets: 108 10*3/uL — ABNORMAL LOW (ref 150–400)
RBC: 3.97 MIL/uL (ref 3.87–5.11)
RDW: 12.4 % (ref 11.5–15.5)
WBC: 10.3 10*3/uL (ref 4.0–10.5)

## 2013-10-13 LAB — RPR

## 2013-10-13 LAB — ABO/RH: ABO/RH(D): A POS

## 2013-10-13 MED ORDER — ACETAMINOPHEN 325 MG PO TABS: 650.0000 mg | ORAL_TABLET | ORAL | Status: DC | PRN

## 2013-10-13 MED ORDER — LIDOCAINE HCL (PF) 1 % IJ SOLN
30.0000 mL | INTRAMUSCULAR | Status: DC | PRN
  Filled 2013-10-13: qty 30

## 2013-10-13 MED ORDER — FLEET ENEMA 7-19 GM/118ML RE ENEM: 1.0000 | ENEMA | Freq: Once | RECTAL | Status: DC

## 2013-10-13 MED ORDER — LIDOCAINE HCL (PF) 1 % IJ SOLN
INTRAMUSCULAR | Status: DC | PRN
Start: 2013-10-13 — End: 2013-10-14
  Administered 2013-10-13 (×2): 8 mL

## 2013-10-13 MED ORDER — ONDANSETRON HCL 4 MG/2ML IJ SOLN
4.0000 mg | Freq: Four times a day (QID) | INTRAMUSCULAR | Status: DC | PRN
  Administered 2013-10-13: 4 mg via INTRAVENOUS
  Filled 2013-10-13: qty 2

## 2013-10-13 MED ORDER — OXYCODONE-ACETAMINOPHEN 5-325 MG PO TABS: 2.0000 | ORAL_TABLET | ORAL | Status: DC | PRN

## 2013-10-13 MED ORDER — OXYTOCIN 40 UNITS IN LACTATED RINGERS INFUSION - SIMPLE MED: 62.5000 mL/h | INTRAVENOUS | Status: DC

## 2013-10-13 MED ORDER — OXYTOCIN BOLUS FROM INFUSION
500.0000 mL | INTRAVENOUS | Status: DC
Start: 2013-10-13 — End: 2013-10-14
  Administered 2013-10-14: 500 mL via INTRAVENOUS

## 2013-10-13 MED ORDER — PHENYLEPHRINE 40 MCG/ML (10ML) SYRINGE FOR IV PUSH (FOR BLOOD PRESSURE SUPPORT)
80.0000 ug | PREFILLED_SYRINGE | INTRAVENOUS | Status: DC | PRN
  Filled 2013-10-13: qty 2
  Filled 2013-10-13: qty 10

## 2013-10-13 MED ORDER — LACTATED RINGERS IV SOLN
500.0000 mL | Freq: Once | INTRAVENOUS | Status: AC
  Administered 2013-10-13: 500 mL via INTRAVENOUS

## 2013-10-13 MED ORDER — PHENYLEPHRINE 40 MCG/ML (10ML) SYRINGE FOR IV PUSH (FOR BLOOD PRESSURE SUPPORT)
80.0000 ug | PREFILLED_SYRINGE | INTRAVENOUS | Status: DC | PRN
  Administered 2013-10-13: 80 ug via INTRAVENOUS
  Filled 2013-10-13: qty 2

## 2013-10-13 MED ORDER — EPHEDRINE 5 MG/ML INJ
10.0000 mg | INTRAVENOUS | Status: DC | PRN
  Filled 2013-10-13: qty 2

## 2013-10-13 MED ORDER — LACTATED RINGERS IV SOLN
INTRAVENOUS | Status: DC
  Administered 2013-10-13 – 2013-10-14 (×3): via INTRAVENOUS

## 2013-10-13 MED ORDER — CITRIC ACID-SODIUM CITRATE 334-500 MG/5ML PO SOLN: 30.0000 mL | ORAL | Status: DC | PRN

## 2013-10-13 MED ORDER — TERBUTALINE SULFATE 1 MG/ML IJ SOLN: 0.2500 mg | Freq: Once | INTRAMUSCULAR | Status: AC | PRN

## 2013-10-13 MED ORDER — FENTANYL 2.5 MCG/ML BUPIVACAINE 1/10 % EPIDURAL INFUSION (WH - ANES)
INTRAMUSCULAR | Status: DC | PRN
  Administered 2013-10-13: 14 mL/h via EPIDURAL

## 2013-10-13 MED ORDER — OXYCODONE-ACETAMINOPHEN 5-325 MG PO TABS: 1.0000 | ORAL_TABLET | ORAL | Status: DC | PRN

## 2013-10-13 MED ORDER — OXYTOCIN 40 UNITS IN LACTATED RINGERS INFUSION - SIMPLE MED
1.0000 m[IU]/min | INTRAVENOUS | Status: DC
  Administered 2013-10-13: 2 m[IU]/min via INTRAVENOUS
  Filled 2013-10-13: qty 1000

## 2013-10-13 MED ORDER — FENTANYL 2.5 MCG/ML BUPIVACAINE 1/10 % EPIDURAL INFUSION (WH - ANES)
14.0000 mL/h | INTRAMUSCULAR | Status: DC | PRN
  Administered 2013-10-13 (×2): 14 mL/h via EPIDURAL
  Filled 2013-10-13 (×3): qty 125

## 2013-10-13 MED ORDER — DIPHENHYDRAMINE HCL 50 MG/ML IJ SOLN
12.5000 mg | INTRAMUSCULAR | Status: DC | PRN
  Administered 2013-10-13: 12.5 mg via INTRAVENOUS
  Filled 2013-10-13: qty 1

## 2013-10-13 MED ORDER — LACTATED RINGERS IV SOLN
500.0000 mL | INTRAVENOUS | Status: DC | PRN
  Administered 2013-10-13: 500 mL via INTRAVENOUS

## 2013-10-13 NOTE — Anesthesia Procedure Notes (Signed)
Epidural Patient location during procedure: OB Start time: 10/13/2013 11:03 AM End time: 10/13/2013 11:07 AM  Staffing Anesthesiologist: Lyn Hollingshead Performed by: anesthesiologist   Preanesthetic Checklist Completed: patient identified, surgical consent, pre-op evaluation, timeout performed, IV checked, risks and benefits discussed and monitors and equipment checked  Epidural Patient position: sitting Prep: site prepped and draped and DuraPrep Patient monitoring: continuous pulse ox and blood pressure Approach: midline Location: L3-L4 Injection technique: LOR air  Needle:  Needle type: Tuohy  Needle gauge: 17 G Needle length: 9 cm and 9 Needle insertion depth: 5 cm cm Catheter type: closed end flexible Catheter size: 19 Gauge Catheter at skin depth: 10 cm Test dose: negative and Other  Assessment Sensory level: T9 Events: blood not aspirated, injection not painful, no injection resistance, negative IV test and no paresthesia  Additional Notes Reason for block:procedure for pain

## 2013-10-13 NOTE — H&P (Signed)
Attestation of Attending Supervision of Advanced Practitioner (CNM/NP): Evaluation and management procedures were performed by the Advanced Practitioner under my supervision and collaboration.  I have reviewed the Advanced Practitioner's note and chart, and I agree with the management and plan.  HARRAWAY-SMITH, Kortney Potvin 10:23 AM

## 2013-10-13 NOTE — H&P (Signed)
Danielle Vincent is a 33 y.o. female presenting for Induction of labor due to history of fast labors.  Maternal Medical History:  Reason for admission: Nausea.    OB History   Grav Para Term Preterm Abortions TAB SAB Ect Mult Living   4 2 2  1  1   2      Past Medical History  Diagnosis Date  . Supervision of other normal pregnancy 10/04/2011     Genetic Screen Neg [place], XX Anatomic [place] Normal female Glucose Screen  GBS  Feeding Preference Breast Contraception  Circumcision n/a    . Molar pregnancy 2012  . Positive PPD, treated    Past Surgical History  Procedure Laterality Date  . Knee surgery  1995  . Dilation and curettage of uterus     Family History: family history includes Cancer in her maternal grandmother; Diabetes in her maternal grandmother, paternal grandfather, and paternal grandmother; Hyperlipidemia in her maternal grandmother and mother; Osteoporosis in her maternal grandmother. Social History:  reports that she has never smoked. She has never used smokeless tobacco. She reports that she does not drink alcohol or use illicit drugs.   Prenatal Transfer Tool  Maternal Diabetes: No Genetic Screening: Normal Maternal Ultrasounds/Referrals: Normal Fetal Ultrasounds or other Referrals:  None Maternal Substance Abuse:  No Significant Maternal Medications:  None Significant Maternal Lab Results:  None Other Comments:  None  Review of Systems  Constitutional: Negative for fever, chills and malaise/fatigue.  Gastrointestinal: Negative for nausea, vomiting and abdominal pain.  Neurological: Negative for dizziness and focal weakness.    Dilation: 4 Effacement (%): 80 Station: -2 Exam by:: m Gavinn Collard cnm Blood pressure 139/80, pulse 67, temperature 98.4 F (36.9 C), temperature source Oral, resp. rate 20, height 5\' 10"  (1.778 m), weight 195 lb (88.451 kg), last menstrual period 01/01/2013, currently breastfeeding. Maternal Exam:  Uterine Assessment:  Contraction strength is mild.  Contraction frequency is irregular.   Abdomen: Fundal height is 38.   Estimated fetal weight is 7-7.5.   Fetal presentation: vertex  Introitus: Normal vulva. Normal vagina.  Vagina is negative for discharge.  Ferning test: not done.  Nitrazine test: not done. Amniotic fluid character: not assessed.  Pelvis: adequate for delivery.   Cervix: Cervix evaluated by digital exam.     Fetal Exam Fetal Monitor Review: Mode: ultrasound.   Baseline rate: 135.  Variability: moderate (6-25 bpm).   Pattern: accelerations present and no decelerations.    Fetal State Assessment: Category I - tracings are normal.     Physical Exam  Constitutional: She is oriented to person, place, and time. She appears well-developed and well-nourished. No distress.  HENT:  Head: Normocephalic.  Cardiovascular: Normal rate.   Respiratory: Effort normal.  GI: Soft. She exhibits no distension and no mass. There is no tenderness. There is no rebound and no guarding.  Genitourinary: Vagina normal and uterus normal. No vaginal discharge found.  Dilation: 4 Effacement (%): 80 Station: -2 Presentation: Vertex Exam by:: m Bettyjo Lundblad cnm   Musculoskeletal: Normal range of motion.  Neurological: She is alert and oriented to person, place, and time.  Skin: Skin is warm and dry.  Psychiatric: She has a normal mood and affect.    Prenatal labs: ABO, Rh: A/POS/-- (02/25 1348) Antibody: NEG (02/25 1348) Rubella: 1.45 (02/25 1348) RPR: NON REAC (07/01 0855)  HBsAg: NEGATIVE (02/25 1348)  HIV: NONREACTIVE (07/01 0855)  GBS: Negative (09/03 0000)   Assessment/Plan: A:  SIUP at [redacted]w[redacted]d  History of rapid labors  P:   Admit to [facility], routine orders        Induction of labor       Pitocin       Epidural for pain          [facility] 10/13/2013, 10:12 AM

## 2013-10-13 NOTE — Progress Notes (Signed)
Patient ID: Danielle Vincent, female   DOB: Mar 30, 1980, 33 y.o.   MRN: [id number] Comfortable with epidural  FHR reassuring  UCs every 2-3 minutes  Dilation: 4 Effacement (%): 80 Station: -2 Presentation: Vertex Exam by:: L Lamon RN  Vertex is very ballotable, so unable to AROM at this time  Will continue Pitocin

## 2013-10-13 NOTE — Progress Notes (Signed)
Danielle Vincent is a 33 y.o. [id number] at [redacted]w[redacted]d by ultrasound admitted for induction of labor due to History of Rapid Labors.  Subjective: Comfortable with epidural Has had slow progress throughout the day despite frequent UCs  Objective: BP 128/75  Pulse 61  Temp(Src) 98.5 F (36.9 C) (Oral)  Resp 18  Ht 5\' 10"  (1.778 m)  Wt 195 lb (88.451 kg)  BMI 27.98 kg/m2  SpO2 99%  LMP 01/01/2013 I/O last 3 completed shifts: In: -  Out: 1250 [Urine:1250]    FHT:  FHR: 135 bpm, variability: moderate,  accelerations:  Present,  decelerations:  Absent UC:   regular, every 2 minutes SVE:   Dilation: 4.5 Effacement (%): 80 Station: -2;Ballotable Exam by:: L Lamon RN  Labs: Lab Results  Component Value Date   WBC 10.3 10/13/2013   HGB 13.1 10/13/2013   HCT 37.3 10/13/2013   MCV 94.0 10/13/2013   PLT 108* 10/13/2013    Assessment / Plan: Induction of labor due to History of Rapid Labor,  progressing well on pitocin  Labor: Latent phase, not in active phase yet Preeclampsia:  n/a Fetal Wellbeing:  Category I Pain Control:  Epidural I/D:  n/a Anticipated MOD:  NSVD  Barbara Keng 10/13/2013, 7:21 PM

## 2013-10-13 NOTE — Progress Notes (Signed)
LABOR PROGRESS NOTE  Danielle Vincent is a 33 y.o. [id number] at [redacted]w[redacted]d  admitted for induction of labor due to hx of rapid labors.  Subjective: Comfortable with epidural, no complaints  Objective: BP 115/72  Pulse 71  Temp(Src) 98.6 F (37 C) (Oral)  Resp 16  Ht 5\' 10"  (1.778 m)  Wt 195 lb (88.451 kg)  BMI 27.98 kg/m2  SpO2 99%  LMP 01/01/2013 or  Filed Vitals:   10/13/13 1931 10/13/13 2000 10/13/13 2031 10/13/13 2101  BP: 120/75 113/74 111/70 115/72  Pulse: 63 63 63 71  Temp:  98.6 F (37 C)    TempSrc:  Oral    Resp: 16 16    Height:      Weight:      SpO2:        Total I/O In: 360 [P.O.:360] Out: -   FHT:  FHR: 145 bpm, variability: moderate,  accelerations:  Present,  decelerations:  Absent UC:   regular, every 2-3 minutes SVE:   Dilation: 5 Effacement (%): 70 Station: -2 Exam by:: Deniece Ree, MD  Dilation: 5 Effacement (%): 70 Station: -2 Presentation: Vertex Exam by:: Deniece Ree, MD  Pitocin @ 10 mu/min  Labs: Lab Results  Component Value Date   WBC 10.3 10/13/2013   HGB 13.1 10/13/2013   HCT 37.3 10/13/2013   MCV 94.0 10/13/2013   PLT 108* 10/13/2013    Assessment / Plan: Induction of labor due to history of rapid labors,  progressing well on pitocin  Labor: slow to progress, unable to AROM 2/2 ballotable, will AROM when able Fetal Wellbeing:  Category I Pain Control:  Epidural Anticipated MOD:  NSVD   Trayon Krantz [name], MD 10/13/2013, 9:31 PM

## 2013-10-13 NOTE — Anesthesia Preprocedure Evaluation (Signed)

## 2013-10-14 ENCOUNTER — Encounter (HOSPITAL_COMMUNITY): Payer: Self-pay

## 2013-10-14 DIAGNOSIS — Z3493 Encounter for supervision of normal pregnancy, unspecified, third trimester: Secondary | ICD-10-CM

## 2013-10-14 DIAGNOSIS — Z833 Family history of diabetes mellitus: Secondary | ICD-10-CM | POA: Diagnosis not present

## 2013-10-14 DIAGNOSIS — O99891 Other specified diseases and conditions complicating pregnancy: Secondary | ICD-10-CM | POA: Diagnosis present

## 2013-10-14 DIAGNOSIS — Z8262 Family history of osteoporosis: Secondary | ICD-10-CM | POA: Diagnosis not present

## 2013-10-14 LAB — CBC
HEMATOCRIT: 35.8 % — AB (ref 36.0–46.0)
HEMOGLOBIN: 12.5 g/dL (ref 12.0–15.0)
MCH: 33.2 pg (ref 26.0–34.0)
MCHC: 34.9 g/dL (ref 30.0–36.0)
MCV: 95.2 fL (ref 78.0–100.0)
Platelets: 102 10*3/uL — ABNORMAL LOW (ref 150–400)
RBC: 3.76 MIL/uL — ABNORMAL LOW (ref 3.87–5.11)
RDW: 12.4 % (ref 11.5–15.5)
WBC: 11.7 10*3/uL — AB (ref 4.0–10.5)

## 2013-10-14 MED ORDER — PRENATAL MULTIVITAMIN CH
1.0000 | ORAL_TABLET | Freq: Every day | ORAL | Status: DC
  Administered 2013-10-14 – 2013-10-16 (×3): 1 via ORAL
  Filled 2013-10-14 (×3): qty 1

## 2013-10-14 MED ORDER — BENZOCAINE-MENTHOL 20-0.5 % EX AERO
1.0000 "application " | INHALATION_SPRAY | CUTANEOUS | Status: DC | PRN
  Administered 2013-10-15: 1 via TOPICAL
  Filled 2013-10-14 (×2): qty 56

## 2013-10-14 MED ORDER — DIPHENHYDRAMINE HCL 25 MG PO CAPS: 25.0000 mg | ORAL_CAPSULE | Freq: Four times a day (QID) | ORAL | Status: DC | PRN

## 2013-10-14 MED ORDER — SENNOSIDES-DOCUSATE SODIUM 8.6-50 MG PO TABS
2.0000 | ORAL_TABLET | ORAL | Status: DC
  Administered 2013-10-15 (×2): 2 via ORAL
  Filled 2013-10-14 (×2): qty 2

## 2013-10-14 MED ORDER — ZOLPIDEM TARTRATE 5 MG PO TABS: 5.0000 mg | ORAL_TABLET | Freq: Every evening | ORAL | Status: DC | PRN

## 2013-10-14 MED ORDER — ONDANSETRON HCL 4 MG/2ML IJ SOLN: 4.0000 mg | INTRAMUSCULAR | Status: DC | PRN

## 2013-10-14 MED ORDER — ONDANSETRON HCL 4 MG PO TABS
4.0000 mg | ORAL_TABLET | ORAL | Status: DC | PRN
Start: 2013-10-14 — End: 2013-10-16

## 2013-10-14 MED ORDER — IBUPROFEN 600 MG PO TABS
600.0000 mg | ORAL_TABLET | Freq: Four times a day (QID) | ORAL | Status: DC
  Administered 2013-10-14 – 2013-10-16 (×9): 600 mg via ORAL
  Filled 2013-10-14 (×10): qty 1

## 2013-10-14 MED ORDER — LANOLIN HYDROUS EX OINT: TOPICAL_OINTMENT | CUTANEOUS | Status: DC | PRN

## 2013-10-14 MED ORDER — TETANUS-DIPHTH-ACELL PERTUSSIS 5-2.5-18.5 LF-MCG/0.5 IM SUSP: 0.5000 mL | Freq: Once | INTRAMUSCULAR | Status: DC

## 2013-10-14 MED ORDER — WITCH HAZEL-GLYCERIN EX PADS: 1.0000 "application " | MEDICATED_PAD | CUTANEOUS | Status: DC | PRN

## 2013-10-14 MED ORDER — SIMETHICONE 80 MG PO CHEW: 80.0000 mg | CHEWABLE_TABLET | ORAL | Status: DC | PRN

## 2013-10-14 MED ORDER — OXYTOCIN 40 UNITS IN LACTATED RINGERS INFUSION - SIMPLE MED
1.0000 m[IU]/min | INTRAVENOUS | Status: DC
Start: 2013-10-14 — End: 2013-10-14

## 2013-10-14 MED ORDER — DIBUCAINE 1 % RE OINT: 1.0000 "application " | TOPICAL_OINTMENT | RECTAL | Status: DC | PRN

## 2013-10-14 NOTE — Progress Notes (Signed)
This note also relates to the following rows which could not be included: Dose (milli-units/min) Oxytocin - Cannot attach notes to extension rows Rate (mL/hr) Oxytocin - Cannot attach notes to extension rows Concentration Oxytocin - Cannot attach notes to extension rows   Pitocin restarted at 9 mu/min per MD Deniece Ree order, after 1 hour break from Pitocin was given to patient.

## 2013-10-14 NOTE — Progress Notes (Addendum)
LABOR PROGRESS NOTE  Danielle Vincent is a 33 y.o. [id number] at [redacted]w[redacted]d  admitted for induction of labor due to hx of rapid labors.  Subjective: Comfortable with epidural, no complaints  Objective: BP 117/76  Pulse 70  Temp(Src) 98.6 F (37 C) (Oral)  Resp 16  Ht 5\' 10"  (1.778 m)  Wt 195 lb (88.451 kg)  BMI 27.98 kg/m2  SpO2 99%  LMP 01/01/2013 or  Filed Vitals:   10/13/13 2201 10/13/13 2231 10/13/13 2300 10/13/13 2330  BP: 103/64 107/66 114/71 117/76  Pulse: 60 61 57 70  Temp:      TempSrc:      Resp:      Height:      Weight:      SpO2:        Total I/O In: 360 [P.O.:360] Out: 925 [Urine:925]  FHT:  FHR: 145 bpm, variability: moderate,  accelerations:  Present,  decelerations:  Absent UC:   regular, every 2-3 minutes SVE:   Dilation: 5 Effacement (%): 70 Station: -2;Ballotable Exam by:: Deniece Ree, MD  Dilation: 5 Effacement (%): 70 Station: -2;Ballotable Presentation: Vertex Exam by:: Deniece Ree, MD  Pitocin @ 10 mu/min  Labs: Lab Results  Component Value Date   WBC 10.3 10/13/2013   HGB 13.1 10/13/2013   HCT 37.3 10/13/2013   MCV 94.0 10/13/2013   PLT 108* 10/13/2013    Assessment / Plan: Induction of labor due to history of rapid labors,  progressing well on pitocin  Labor: slow to progress, unable to AROM 2/2 ballotable, will AROM when able ==> increase pitocin, if no change at next exam will give pitocin break Fetal Wellbeing:  Category I Pain Control:  Epidural Anticipated MOD:  NSVD   Danielle Vincent [name], MD 10/14/2013, 12:12 AM

## 2013-10-14 NOTE — Anesthesia Postprocedure Evaluation (Signed)
  Anesthesia Post-op Note  Patient: Danielle Vincent  Procedure(s) Performed: * No procedures listed *  Patient Location: Mother/Baby  Anesthesia Type:Epidural  Level of Consciousness: awake  Airway and Oxygen Therapy: Patient Spontanous Breathing  Post-op Pain: mild  Post-op Assessment: Patient's Cardiovascular Status Stable and Respiratory Function Stable  Post-op Vital Signs: stable  Last Vitals:  Filed Vitals:   10/14/13 1130  BP: 131/69  Pulse: 75  Temp: 36.9 C  Resp: 18    Complications: No apparent anesthesia complications

## 2013-10-14 NOTE — Progress Notes (Addendum)
LABOR PROGRESS NOTE  Danielle Vincent is a 33 y.o. [id number] at [redacted]w[redacted]d  admitted for induction of labor due to hx of rapid labors.  Subjective: Comfortable with epidural, no complaints  Objective: BP 113/80  Pulse 78  Temp(Src) 98.6 F (37 C) (Oral)  Resp 18  Ht 5\' 10"  (1.778 m)  Wt 195 lb (88.451 kg)  BMI 27.98 kg/m2  SpO2 99%  LMP 01/01/2013 or  Filed Vitals:   10/14/13 0200 10/14/13 0230 10/14/13 0300 10/14/13 0400  BP: 106/61 106/59 109/70 113/80  Pulse: 62 59 67 78  Temp:    98.6 F (37 C)  TempSrc:    Oral  Resp:    18  Height:      Weight:      SpO2:        Total I/O In: 360 [P.O.:360] Out: 925 [Urine:925]  FHT:  FHR: 135bpm, variability: moderate,  accelerations:  Present,  decelerations:  Absent UC:   regular, every 2-3 minutes SVE:   Dilation: 6 Effacement (%): 80 Station: -2;Ballotable Exam by:: Dr. Deniece Vincent  Dilation: 6 Effacement (%): 14 Station: -2;Ballotable Presentation: Vertex Exam by:: Dr. Deniece Vincent  Pitocin @ 18 mu/min  Labs: Lab Results  Component Value Date   WBC 10.3 10/13/2013   HGB 13.1 10/13/2013   HCT 37.3 10/13/2013   MCV 94.0 10/13/2013   PLT 108* 10/13/2013    Assessment / Plan: Induction of labor due to history of rapid labors,  progressing well on pitocin  Labor: slow to progress, unable to AROM 2/2 ballotable, will AROM when able ==>will give 1hour pitocin break as has been on pitocin for 16hours, will restart at 1/2 at 0500 Fetal Wellbeing:  Category I Pain Control:  Epidural Anticipated MOD:  NSVD   Danielle Grzywacz [name], MD 10/14/2013, 4:08 AM

## 2013-10-15 LAB — CBC
HEMATOCRIT: 33.1 % — AB (ref 36.0–46.0)
Hemoglobin: 11.5 g/dL — ABNORMAL LOW (ref 12.0–15.0)
MCH: 32.9 pg (ref 26.0–34.0)
MCHC: 34.7 g/dL (ref 30.0–36.0)
MCV: 94.6 fL (ref 78.0–100.0)
Platelets: 90 10*3/uL — ABNORMAL LOW (ref 150–400)
RBC: 3.5 MIL/uL — ABNORMAL LOW (ref 3.87–5.11)
RDW: 12.5 % (ref 11.5–15.5)
WBC: 11 10*3/uL — ABNORMAL HIGH (ref 4.0–10.5)

## 2013-10-15 NOTE — Lactation Note (Signed)
This note was copied from the chart of Danielle Vincent. Lactation Consultation Note Experienced BF mom of 14 months X 2 other children. With both children didn't BF on RT. Nipple d/t semi flat and has skin tags X2.Mom has nipple shell to assist nipple in everting. Noted everting nipple when nipple shield removed. Lt. Nipple everted well all the time and doesn't wear a nipple shell. Has no difficulty latching to Lt. Nipple. Mom had to pump the Rt. Breast and give in a bottle. Mom fitted w/NS #24 and felt comfortable and baby latched well and BF well to Rt. Breast. Mom excited stating "I think she likes it". In hopes this will solve her problem and will be able to BF to Rt. Breast w/o having to pump and bottle feed. Encouraged to F/U with out pt. LC d/t wearing nipple shield. [facility] brochure given w/resources, support groups and [facility] services.Mom encouraged to feed baby 8-12 times/24 hours and with feeding cues.  Educated about newborn behavior. Encouraged comfort during BF so colostrum flows better and mom will enjoy the feeding longer. Taking deep breaths and breast massage during BF. Mom encouraged to waken baby for feeds. Mom placed in shells and encouraged to wear them between feedings. Mom taught how to apply & clean nipple shield.  Patient Name: Danielle Vincent [id number] Date: 10/15/2013 Reason for consult: Initial assessment   Maternal Data Has patient been taught Hand Expression?: Yes  Feeding Feeding Type: Breast Fed  LATCH Score/Interventions Latch: Grasps breast easily, tongue down, lips flanged, rhythmical sucking. Intervention(s): Assist with latch;Adjust position;Breast massage;Breast compression  Audible Swallowing: A few with stimulation Intervention(s): Skin to skin;Hand expression Intervention(s): Skin to skin;Hand expression;Alternate breast massage  Type of Nipple: Everted at rest and after stimulation (Rt. nipple semi flat and has skin  tags) Intervention(s): Shells  Comfort (Breast/Nipple): Soft / non-tender     Hold (Positioning): Assistance needed to correctly position infant at breast and maintain latch. Intervention(s): Breastfeeding basics reviewed;Support Pillows;Position options;Skin to skin  LATCH Score: 8  Lactation Tools Discussed/Used Tools: Shells;Nipple Shields Nipple shield size: 24 Shell Type: Inverted   Consult Status Consult Status: Follow-up Date: 09/15/13 Follow-up type: In-patient    Theodoro Kalata 10/15/2013, 5:31 AM

## 2013-10-15 NOTE — Progress Notes (Signed)
Post Partum Day 1 SVD  Subjective: no complaints, up ad lib, voiding, tolerating PO and + flatus  Objective: Blood pressure 116/62, pulse 67, temperature 97.6 F (36.4 C), temperature source Oral, resp. rate 16, height 5\' 10"  (1.778 m), weight 88.451 kg (195 lb), last menstrual period 01/01/2013, SpO2 99.00%, unknown if currently breastfeeding.  Physical Exam:  General: alert, cooperative and no distress Lochia: appropriate Uterine Fundus: firm DVT Evaluation: No evidence of DVT seen on physical exam. Negative Homan's sign. No cords or calf tenderness. No significant calf/ankle edema.   Recent Labs  10/14/13 0858 10/15/13 0535  HGB 12.5 11.5*  HCT 35.8* 33.1*    Assessment/Plan: Plan for discharge tomorrow, Breastfeeding and Contraception does not want to do anything for birth control until her milk comes in. States she'll discuss with MD postpartum.   LOS: 2 days   Archie Patten 10/15/2013, 7:42 AM

## 2013-10-15 NOTE — Progress Notes (Signed)
I examined pt and agree with documentation above and resident plan of care. Venia Carbon Michiel Cowboy, CNM

## 2013-10-15 NOTE — Lactation Note (Signed)
This note was copied from the chart of Danielle Danielle Vincent. Lactation Consultation Note Follow up visit at 35 hours of age. Mom reports good feedings and the nipple shield is working great on her right breast.  Mom sees colostrum and denies pain with NS.  Baby latches to left breast independently with encouragement to hold baby close with cheeks touching breast.  Mom has questions regarding pumping and bottle feeding for when she returns to work.  Questions answered and encouraged to follow up with outpt due to nipple shield use.  Mom to call for assist as needed.   Patient Name: Danielle Vincent [id number] Date: 10/15/2013 Reason for consult: Follow-up assessment   Maternal Data    Feeding Feeding Type: Breast Fed Length of feed: 10 min  LATCH Score/Interventions Latch: Grasps breast easily, tongue down, lips flanged, rhythmical sucking.  Audible Swallowing: Spontaneous and intermittent Intervention(s): Skin to skin;Hand expression  Type of Nipple: Everted at rest and after stimulation  Comfort (Breast/Nipple): Soft / non-tender     Hold (Positioning): Assistance needed to correctly position infant at breast and maintain latch. Intervention(s): Skin to skin;Position options;Support Pillows;Breastfeeding basics reviewed  LATCH Score: 9  Lactation Tools Discussed/Used     Consult Status Consult Status: Follow-up Date: 10/16/13 Follow-up type: In-patient    Danielle Vincent, Justine Null 10/15/2013, 9:12 PM

## 2013-10-16 MED ORDER — IBUPROFEN 600 MG PO TABS: 600.0000 mg | ORAL_TABLET | Freq: Four times a day (QID) | ORAL | Status: DC

## 2013-10-16 NOTE — Discharge Instructions (Signed)

## 2013-10-16 NOTE — Lactation Note (Signed)
This note was copied from the chart of Danielle Vincent. Lactation Consultation Note  Patient Name: Danielle Vincent [id number] Date: 10/16/2013 Reason for consult: Follow-up assessment  Mom reports baby is nursing well. She is using #24 nipple shield on right breast, baby is latching without nipple shield on left breast. Mom reports observing breast milk in the nipple shield with baby nursing. Denies pain or discomfort, reports breast are starting to fill. Engorgement care reviewed if needed. Baby asleep and did not see latch, however #24 nipple shield appears to be right size, gave Mom #20 to use if breast change over the next few days as milk comes in and she needs to change the size. Reviewed with Mom how to apply nipple shield. Advised baby should be at the breast 8-12 times in 24 hours. Advised she may need to pre-pump when her milk comes in to help with latch. OP f/u scheduled for Tuesday, 10/23/13 at 10:30 am. Encouraged Mom to call if she would like LC to observe latch before d/c home.   Maternal Data Formula Feeding for Exclusion: No  Feeding Feeding Type: Breast Fed Length of feed: 20 min  LATCH Score/Interventions                      Lactation Tools Discussed/Used Tools: Nipple Shields Nipple shield size: 20;24   Consult Status Consult Status: Complete Date: 10/16/13 Follow-up type: In-patient    Katrine Coho 10/16/2013, 9:15 AM

## 2013-10-16 NOTE — Discharge Summary (Signed)
Obstetric Discharge Summary Reason for Admission: Induction of labor due to history of fast labors. Prenatal Procedures: none Intrapartum Procedures: spontaneous vaginal delivery Postpartum Procedures: none Complications-Operative and Postpartum: small bleeding 1st degree perineal laceration  Delivery Note At 8:00 AM a viable female was delivered via Vaginal, Spontaneous Delivery (Presentation: Left Occiput Anterior).  APGAR: 9, 9; weight 8 lb 2 oz (3685 g).   Placenta status: Intact, Spontaneous.  Cord: 3 vessels with the following complications: Short.    Anesthesia: Epidural  Episiotomy: None Lacerations: Perineal Suture Repair: 4.0 vicryl rapide Est. Blood Loss (mL): 200  Mom to postpartum.  Baby to Couplet care / Skin to Skin.  Danielle Vincent 10/16/2013, 8:50 AM     Hospital Course:  Active Problems:   Pregnancy   Normal pregnancy in third trimester   Today: No acute events overnight.  Pt denies problems with ambulating, voiding or po intake.  She denies nausea or vomiting.  Pain is well controlled.  She has had flatus. She has had bowel movement.  Lochia Minimal.  Plan for birth control is  undecided.  Method of Feeding: Breast   Danielle Vincent is a 33 y.o. [phone] s/p NSVD.  Patient presented to [facility] for IOL 2/2 history of fast labors and was admitted to L&D.  She has postpartum course that was uncomplicated including no problems with ambulating, PO intake, urination, pain, or bleeding. The pt feels ready to go home and  will be discharged with outpatient follow-up.    H/H: Lab Results  Component Value Date/Time   HGB 11.5* 10/15/2013  5:35 AM   HGB 14.4 05/17/2011   HCT 33.1* 10/15/2013  5:35 AM   HCT 42 05/17/2011    Discharge Diagnoses: Term Pregnancy-delivered  Discharge Information: Date: 10/16/2013 Activity: pelvic rest Diet: routine  Medications: PNV and Ibuprofen Breast feeding:  Yes Condition: stable Instructions: refer to  handout Discharge to: home   Discharge Instructions   Activity as tolerated    Complete by:  As directed      Call MD for:  difficulty breathing, headache or visual disturbances    Complete by:  As directed      Call MD for:  extreme fatigue    Complete by:  As directed      Call MD for:  hives    Complete by:  As directed      Call MD for:  persistant dizziness or light-headedness    Complete by:  As directed      Call MD for:  persistant nausea and vomiting    Complete by:  As directed      Call MD for:  severe uncontrolled pain    Complete by:  As directed      Call MD for:  temperature >100.4    Complete by:  As directed      Diet - low sodium heart healthy    Complete by:  As directed      Discharge instructions    Complete by:  As directed   Taking care of yourself after Baby arrives. Vaginal Bleeding: Vaginal bleeding is common after delivery, with the amount decreasing gradually over 1-2 weeks. If the bleeding increases, is mixed with pus, or is foul-smelling, call your doctor, as this may be a sign of infection.  Abdominal Pain: Abdominal cramping after delivery is common, especially when you breastfeed. The same hormones responsible for letting milk down to your nipple also contract your uterus. If the pain worsens, or  occurs more frequently over 48 hrs after delivery, call your doctor.  Fevers: After delivery you are at increased risk of developing an infection. If you have a fever, increased vaginal bleeding, foul-smelling vaginal discharge, or increased abdominal pain, call your doctor.  Breast Feeding: Feeding every 1.5-3 hours keeps Baby satisfied and your milk in good supply. If 3 hours have gone by and Baby is sleeping, wake him/her up to feed. Nurse for 15-20 minutes on one breast before offering the other. Breast-fed babies often lose up to 7% of their birth weight in the first few days of life, but should start gaining about an ounce per day after 4 days. For more  information about breastfeeding, go to [email or web address].  Mastitis (Breast infection): Breaks in the skin or bacteria passing into your breast ducts can cause an infection. If you notice a triangular shaped area on your breast that is red, warm to the touch and tender, call your doctor. It is safe for Baby and helps you to heal faster if you keep breast feeding through this infection.  Postpartum Depression: Postpartum depression is very common after a woman delivers because of all the hormonal changes happening in her body. If you notice that you start to feel more sad or anxious than usual or have any thoughts of hurting yourself or Baby, tell someone right away.  If you have any questions or concerns, please call your doctor.            Medication List         ibuprofen 600 MG tablet  Commonly known as:  ADVIL,MOTRIN  Take 1 tablet (600 mg total) by mouth every 6 (six) hours.     prenatal multivitamin Tabs tablet  Take 1 tablet by mouth at bedtime.         Danielle Vincent ,MD OB Fellow 10/16/2013,8:50 AM

## 2013-10-17 NOTE — Discharge Summary (Signed)
Attestation of Attending Supervision of Obstetric Fellow: Evaluation and management procedures were performed by the Obstetric Fellow under my supervision and collaboration.  I have reviewed the Obstetric Fellow's note and chart, and I agree with the management and plan.  Stanislav Gervase, DO Attending Physician Faculty Practice, [facility]  

## 2013-10-23 ENCOUNTER — Ambulatory Visit (HOSPITAL_COMMUNITY)
Admit: 2013-10-23 | Discharge: 2013-10-23 | Disposition: A | Discharge status: Home / Self Care | Payer: BC Managed Care – PPO | Attending: Obstetrics & Gynecology | Admitting: Obstetrics & Gynecology

## 2013-10-23 NOTE — Lactation Note (Signed)
Lactation ConsultBaby's Name: Danielle Vincent  Date of Birth: 10/14/2014   Gender: female  Gestational Age: [redacted]w[redacted]d (At Birth)  Birth Weight: 8 lb 2 oz (3685 g)  Weight at Discharge: Weight: 7 lb 10.2 oz (3465 g) Date of Discharge: 10/16/2013  [facility] Weights   10/14/13 0800 10/15/13 0040 10/16/13 0021  Weight: 8 lb 2 oz (3685 g) 7 lb 12.2 oz (3520 g) 7 lb 10.2 oz (3465 g)  Weight today: 3882 g  8-9oz    Mother's reason for visit:   To check latch Visit Type:  Feeding assessment Appointment Notes:  Mom reports that right breast always makes less milk than the left. Consult:  Initial Lactation Consultant:  Linus Mako D  ________________________________________________________________________    ________________________________________________________________________  Mother's Name: Danielle Vincent Type of delivery:   Breastfeeding Experience:  p3 Experienced Bf mom breast fed her first for 16 months, second for 13 months  ________________________________________________________________________  Breastfeeding History (Post Discharge)  Frequency of breastfeeding:  q 2-4 Duration of feeding:  25-30 min    Pumping  Type of pump:  Medela pump in style Frequency:  3 times/day  Volume:  4-6 oz  Infant Intake and Output Assessment  Voids:  10-12 in 24 hrs.  Color:  Clear yellow Stools:  3-5 in 24 hrs.  Color:  Yellow  ________________________________________________________________________  Maternal Breast Assessment  Breast:  Soft Nipple:  Erect Pain level:  0 Pain interventions:  NA  _______________________________________________________________________ Feeding Assessment/Evaluation  Initial feeding assessment:  Infant's oral assessment:  WNL  Positioning:  Football Right breast  LATCH documentation:  Latch:  2 = Grasps breast easily, tongue down, lips flanged, rhythmical sucking.  Audible swallowing:  2 = Spontaneous and intermittent  Type of  nipple:  2 = Everted at rest and after stimulation  Comfort (Breast/Nipple):  2 = Soft / non-tender  Hold (Positioning):  2 = No assistance needed to correctly position infant at breast  LATCH score:  10  Attached assessment:  Deep  Lips flanged:  Yes.    Lips untucked:  No.  Suck assessment:  Nutritive   Pre-feed weight:  3882 g  (8 lb. 9 oz.) Post-feed weight:  3904 g (8 lb. 9.7 oz.) Amount transferred:  22 ml Amount supplemented:  0 ml  Additional Feeding Assessment -   Infant's oral assessment:  WNL  Positioning:  Cradle Left breast  LATCH documentation:  Latch:  2 = Grasps breast easily, tongue down, lips flanged, rhythmical sucking.  Audible swallowing:  2 = Spontaneous and intermittent  Type of nipple:  2 = Everted at rest and after stimulation  Comfort (Breast/Nipple):  2 = Soft / non-tender  Hold (Positioning):  2 = No assistance needed to correctly position infant at breast  LATCH score:  10  Attached assessment:  Deep  Lips flanged:  Yes.    Lips untucked:  No.  Suck assessment:  Nutritive  Pre-feed weight:  3904 g  (8 lb. 9.7 oz.) Post-feed weight:  3928 g (8 lb. 10.6 oz.) Amount transferred: 24  ml Amount supplemented:  0 ml     Total amount transferred:  46 ml Total supplement given:  0 ml  Mom is experienced BF of greater than 1 year with 2 previous children. Mom reports that she always makes less milk on the right breast and her babies don't like to nurse on that side. Tess latches easily to that breast with many swallows noted. She chokes on the breast and comes off to  catch her breast 3 times during the feeding. Latched back on easily and nursed for 10 min. Nursed well on the left breast for 15 min then off to sleep. No choking noted on that side. Mom asking about making more milk on that breast- has been pumping already on that side. Discussed differences in breasts and that sometimes we are not made exactly the same. As long as she makes enough  milk for baby that is enough.  Reviewed [facility] as another resource for support. No further questions at present. To call prn.

## 2013-11-19 ENCOUNTER — Encounter (HOSPITAL_COMMUNITY): Payer: Self-pay

## 2013-12-07 ENCOUNTER — Encounter: Payer: Self-pay | Admitting: Advanced Practice Midwife

## 2013-12-07 ENCOUNTER — Ambulatory Visit: Payer: BC Managed Care – PPO | Admitting: Advanced Practice Midwife

## 2013-12-07 ENCOUNTER — Ambulatory Visit (INDEPENDENT_AMBULATORY_CARE_PROVIDER_SITE_OTHER): Payer: BC Managed Care – PPO | Admitting: Advanced Practice Midwife

## 2013-12-07 VITALS — BP 136/84 | HR 65 | Resp 16 | Ht 70.0 in | Wt 169.0 lb

## 2013-12-07 DIAGNOSIS — Z30019 Encounter for initial prescription of contraceptives, unspecified: Secondary | ICD-10-CM

## 2013-12-07 DIAGNOSIS — O99119 Other diseases of the blood and blood-forming organs and certain disorders involving the immune mechanism complicating pregnancy, unspecified trimester: Secondary | ICD-10-CM

## 2013-12-07 DIAGNOSIS — D696 Thrombocytopenia, unspecified: Secondary | ICD-10-CM

## 2013-12-07 LAB — CBC
HCT: 40.6 % (ref 36.0–46.0)
Hemoglobin: 13.5 g/dL (ref 12.0–15.0)
MCH: 31.7 pg (ref 26.0–34.0)
MCHC: 33.3 g/dL (ref 30.0–36.0)
MCV: 95.3 fL (ref 78.0–100.0)
MPV: 10.8 fL (ref 9.4–12.4)
PLATELETS: 247 10*3/uL (ref 150–400)
RBC: 4.26 MIL/uL (ref 3.87–5.11)
RDW: 12.7 % (ref 11.5–15.5)
WBC: 6.7 10*3/uL (ref 4.0–10.5)

## 2013-12-07 MED ORDER — ETONOGESTREL-ETHINYL ESTRADIOL 0.12-0.015 MG/24HR VA RING: VAGINAL_RING | VAGINAL | Status: DC

## 2013-12-07 NOTE — Patient Instructions (Signed)
Levonorgestrel intrauterine device (IUD) What is this medicine? LEVONORGESTREL IUD (LEE voe nor jes trel) is a contraceptive (birth control) device. The device is placed inside the uterus by a healthcare professional. It is used to prevent pregnancy and can also be used to treat heavy bleeding that occurs during your period. Depending on the device, it can be used for 3 to 5 years. This medicine may be used for other purposes; ask your health care provider or pharmacist if you have questions. COMMON BRAND NAME(S): LILETTA, Mirena, Skyla What should I tell my health care provider before I take this medicine? They need to know if you have any of these conditions: -abnormal Pap smear -cancer of the breast, uterus, or cervix -diabetes -endometritis -genital or pelvic infection now or in the past -have more than one sexual partner or your partner has more than one partner -heart disease -history of an ectopic or tubal pregnancy -immune system problems -IUD in place -liver disease or tumor -problems with blood clots or take blood-thinners -use intravenous drugs -uterus of unusual shape -vaginal bleeding that has not been explained -an unusual or allergic reaction to levonorgestrel, other hormones, silicone, or polyethylene, medicines, foods, dyes, or preservatives -pregnant or trying to get pregnant -breast-feeding How should I use this medicine? This device is placed inside the uterus by a health care professional. Talk to your pediatrician regarding the use of this medicine in children. Special care may be needed. Overdosage: If you think you have taken too much of this medicine contact a poison control center or emergency room at once. NOTE: This medicine is only for you. Do not share this medicine with others. What if I miss a dose? This does not apply. What may interact with this medicine? Do not take this medicine with any of the following  medications: -amprenavir -bosentan -fosamprenavir This medicine may also interact with the following medications: -aprepitant -barbiturate medicines for inducing sleep or treating seizures -bexarotene -griseofulvin -medicines to treat seizures like carbamazepine, ethotoin, felbamate, oxcarbazepine, phenytoin, topiramate -modafinil -pioglitazone -rifabutin -rifampin -rifapentine -some medicines to treat HIV infection like atazanavir, indinavir, lopinavir, nelfinavir, tipranavir, ritonavir -St. John's wort -warfarin This list may not describe all possible interactions. Give your health care provider a list of all the medicines, herbs, non-prescription drugs, or dietary supplements you use. Also tell them if you smoke, drink alcohol, or use illegal drugs. Some items may interact with your medicine. What should I watch for while using this medicine? Visit your doctor or health care professional for regular check ups. See your doctor if you or your partner has sexual contact with others, becomes HIV positive, or gets a sexual transmitted disease. This product does not protect you against HIV infection (AIDS) or other sexually transmitted diseases. You can check the placement of the IUD yourself by reaching up to the top of your vagina with clean fingers to feel the threads. Do not pull on the threads. It is a good habit to check placement after each menstrual period. Call your doctor right away if you feel more of the IUD than just the threads or if you cannot feel the threads at all. The IUD may come out by itself. You may become pregnant if the device comes out. If you notice that the IUD has come out use a backup birth control method like condoms and call your health care provider. Using tampons will not change the position of the IUD and are okay to use during your period. What side effects may   I notice from receiving this medicine? Side effects that you should report to your doctor or  health care professional as soon as possible: -allergic reactions like skin rash, itching or hives, swelling of the face, lips, or tongue -fever, flu-like symptoms -genital sores -high blood pressure -no menstrual period for 6 weeks during use -pain, swelling, warmth in the leg -pelvic pain or tenderness -severe or sudden headache -signs of pregnancy -stomach cramping -sudden shortness of breath -trouble with balance, talking, or walking -unusual vaginal bleeding, discharge -yellowing of the eyes or skin Side effects that usually do not require medical attention (report to your doctor or health care professional if they continue or are bothersome): -acne -breast pain -change in sex drive or performance -changes in weight -cramping, dizziness, or faintness while the device is being inserted -headache -irregular menstrual bleeding within first 3 to 6 months of use -nausea This list may not describe all possible side effects. Call your doctor for medical advice about side effects. You may report side effects to FDA at 1-800-FDA-1088. Where should I keep my medicine? This does not apply. NOTE: This sheet is a summary. It may not cover all possible information. If you have questions about this medicine, talk to your doctor, pharmacist, or health care provider.  2015, [employer]vier/Gold Standard. (2011-02-04 13:54:04)  

## 2013-12-07 NOTE — Progress Notes (Signed)
  Subjective:     Danielle Vincent is a 33 y.o. female who presents for a postpartum visit. She is 7 weeks postpartum following a spontaneous vaginal delivery. I have fully reviewed the prenatal and intrapartum course. The delivery was at 40.1 gestational weeks. Outcome: spontaneous vaginal delivery. Anesthesia: epidural. Postpartum course has been normal. Baby's course has been normal. Baby is feeding by breast. Bleeding no bleeding. Bowel function is normal. Bladder function is normal. Patient is not sexually active. Contraception method is abstinence. Postpartum depression screening: negative.  Had thrombocytopenia in pregnancy.   The following portions of the patient's history were reviewed and updated as appropriate: allergies, current medications, past family history, past medical history, past social history, past surgical history and problem list.  Last Pap 02/2013. Normal w/ Neg HRHPV.  Review of Systems Pertinent items are noted in HPI.   Objective:    BP 136/84 mmHg  Pulse 65  Resp 16  Ht 5\' 10"  (1.778 m)  Wt 169 lb (76.658 kg)  BMI 24.25 kg/m2  Breastfeeding? Yes  General:  alert, cooperative, appears stated age and no distress   Breasts:  Declined exam.   Lungs: clear to auscultation bilaterally  Heart:  regular rate and rhythm, S1, S2 normal, no murmur, click, rub or gallop  Abdomen: soft, non-tender; bowel sounds normal; no masses,  no organomegaly and Fundus non-palpable   Vulva:  not evaluated  Vagina: not evaluated                    Assessment:     Normal postpartum exam. Pap smear not done at today's visit.   Plan:    1. Contraception: NuvaRing vaginal inserts. Considering Mirena in the future. Handout given. 2. Pap in 4.5 years 3. CBC 3. Follow up in: 1 year or as needed for Mirena.

## 2013-12-10 ENCOUNTER — Ambulatory Visit (INDEPENDENT_AMBULATORY_CARE_PROVIDER_SITE_OTHER): Payer: BC Managed Care – PPO | Admitting: Obstetrics & Gynecology

## 2013-12-10 ENCOUNTER — Encounter: Payer: Self-pay | Admitting: Obstetrics & Gynecology

## 2013-12-10 ENCOUNTER — Telehealth: Payer: Self-pay | Admitting: *Deleted

## 2013-12-10 VITALS — BP 120/76 | HR 59 | Resp 16 | Ht 70.0 in | Wt 171.0 lb

## 2013-12-10 DIAGNOSIS — Z30430 Encounter for insertion of intrauterine contraceptive device: Secondary | ICD-10-CM | POA: Diagnosis not present

## 2013-12-10 DIAGNOSIS — Z01812 Encounter for preprocedural laboratory examination: Secondary | ICD-10-CM

## 2013-12-10 MED ORDER — LEVONORGESTREL 20 MCG/24HR IU IUD
1.0000 | INTRAUTERINE_SYSTEM | Freq: Once | INTRAUTERINE | Status: AC
  Administered 2013-12-10: 1 via INTRAUTERINE

## 2013-12-10 NOTE — Progress Notes (Signed)
Erroneous encounter

## 2013-12-10 NOTE — Telephone Encounter (Signed)
Pt notified of normal platelets.  Copy given to pt.

## 2013-12-10 NOTE — Progress Notes (Signed)
   Subjective:    Patient ID: Danielle Vincent, female    DOB: 1980-08-28, 33 y.o.   MRN: [id number]  HPI She is here for Mirena insertion.   Review of Systems     Objective:   Physical Exam  UPT negative, consent signed, Time out procedure done. Cervix prepped with betadine. Mirena was easily placed and the strings were cut to 3-4 cm. Uterus sounded to 9 cm. She tolerated the procedure well.        Assessment & Plan:  Contraception- Mirena

## 2013-12-10 NOTE — Addendum Note (Signed)
Addended by: Gretchen Short on: 12/10/2013 02:59 PM   Modules accepted: Orders

## 2013-12-10 NOTE — Telephone Encounter (Signed)
-----   Message from [place], [place] sent at 12/10/2013  4:03 AM EST ----- Please inform patient of normal platelet count. No further workup needed at this time.

## 2014-01-14 ENCOUNTER — Ambulatory Visit (INDEPENDENT_AMBULATORY_CARE_PROVIDER_SITE_OTHER): Payer: BC Managed Care – PPO | Admitting: Obstetrics & Gynecology

## 2014-01-14 ENCOUNTER — Encounter: Payer: Self-pay | Admitting: Obstetrics & Gynecology

## 2014-01-14 VITALS — BP 128/85 | HR 77 | Resp 16 | Ht 70.0 in | Wt 168.0 lb

## 2014-01-14 DIAGNOSIS — Z30431 Encounter for routine checking of intrauterine contraceptive device: Secondary | ICD-10-CM

## 2014-01-14 MED ORDER — NORETHINDRONE ACETATE 5 MG PO TABS: ORAL_TABLET | ORAL | Status: DC

## 2014-01-14 NOTE — Progress Notes (Signed)
   Subjective:    Patient ID: Danielle Vincent, female    DOB: [birth year], 33 y.o.   MRN: [id number]  HPI She has had daily bleeding since the Mirena was placed. It is gradually decreasing in amount. She does not having any other problems.   Review of Systems She is breastfeeding.    Objective:   Physical Exam  Strings seen      Assessment & Plan:  Irregular bleeding with Mirena Norethindrone prn

## 2014-08-22 ENCOUNTER — Ambulatory Visit: Payer: Self-pay | Admitting: Obstetrics & Gynecology

## 2014-11-15 IMAGING — US US OB FOLLOW-UP
1 series · 12 of 28 positions shown · non-contrast
Comparison: none

[Series 1: us ob follow up · 12 of 70 slices shown]
[im 3/70]
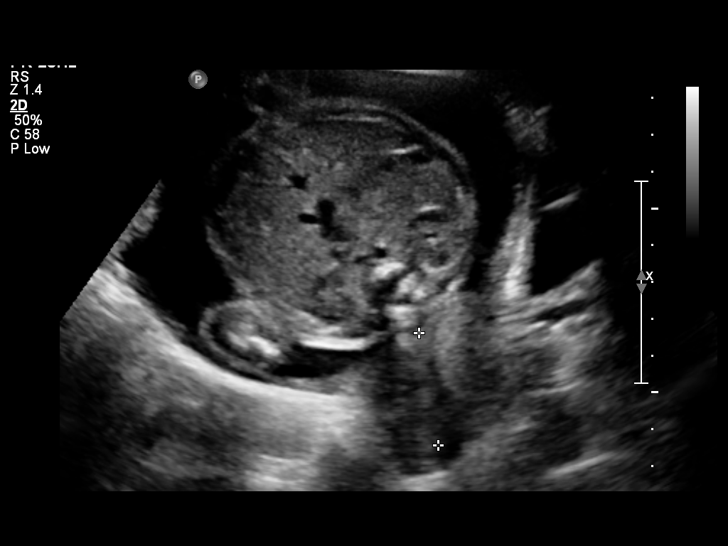
[im 8/70]
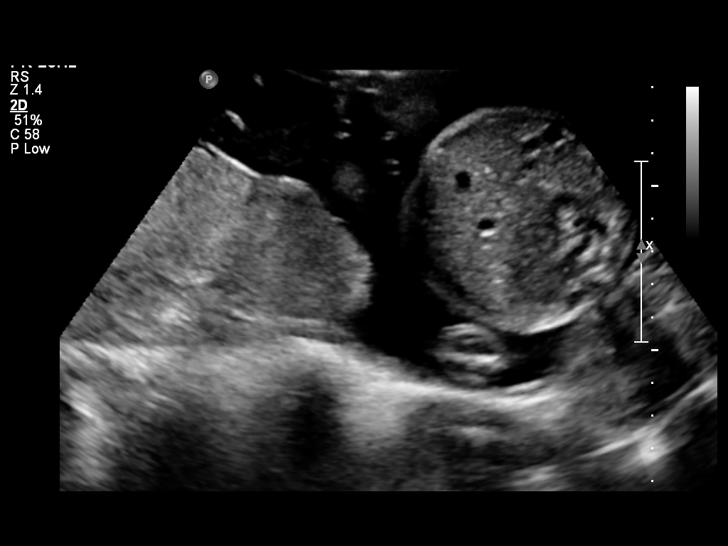
[im 13/70]
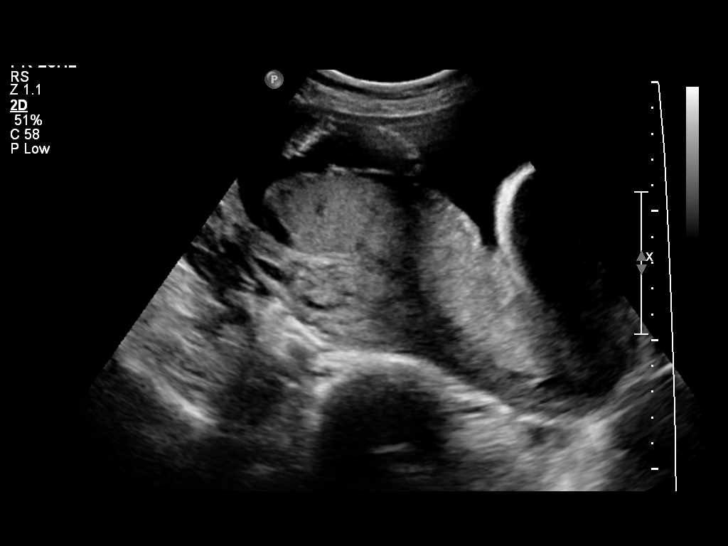
[im 21/70]
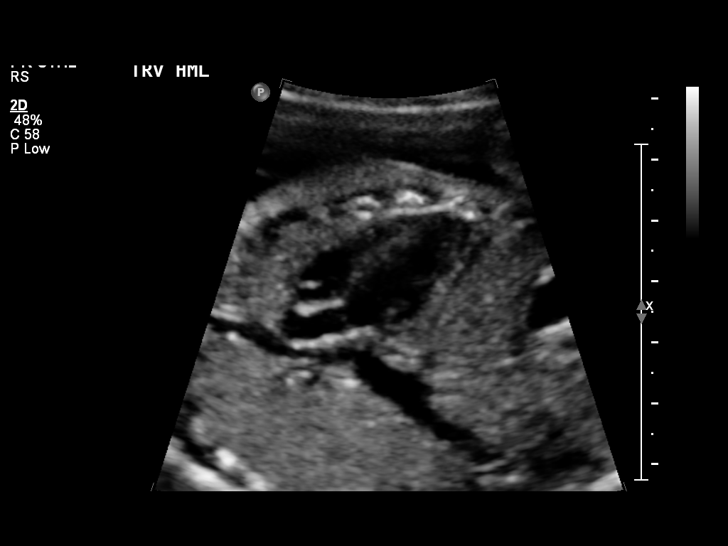
[im 26/70]
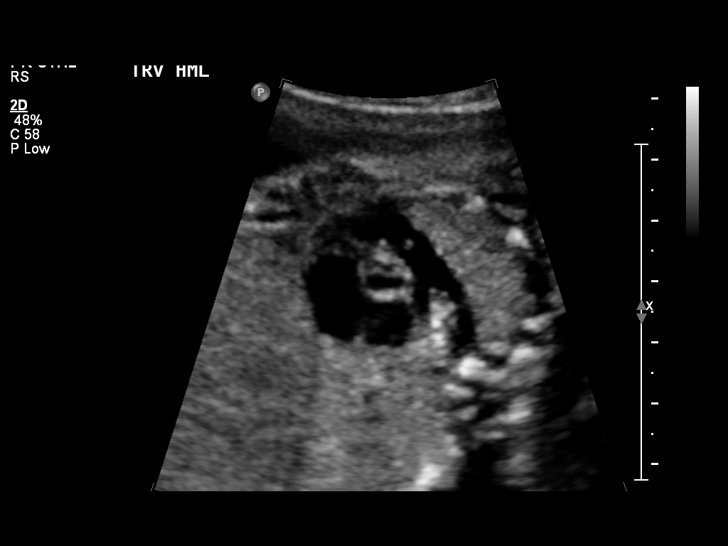
[im 31/70]
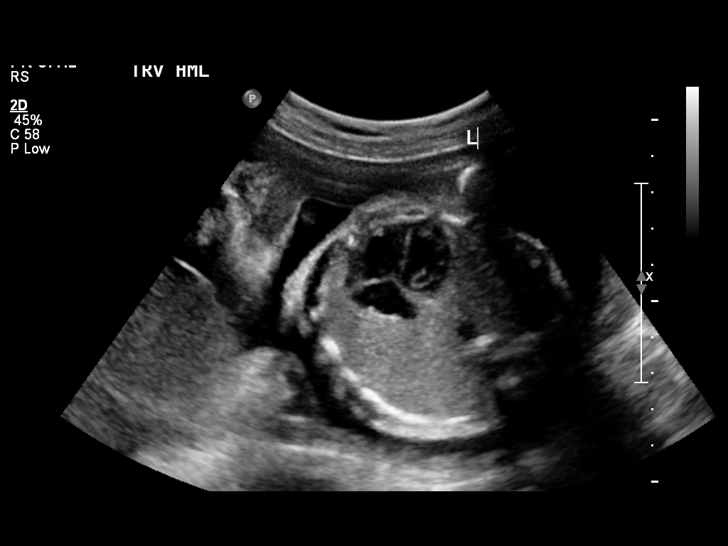
[im 39/70]
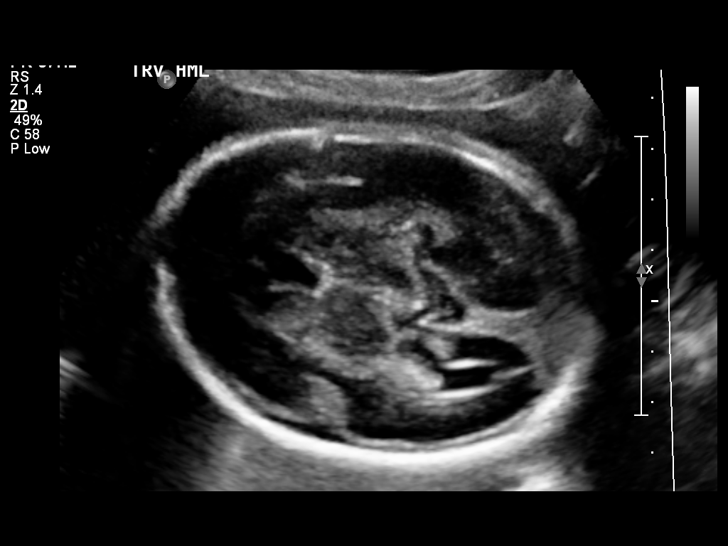
[im 44/70]
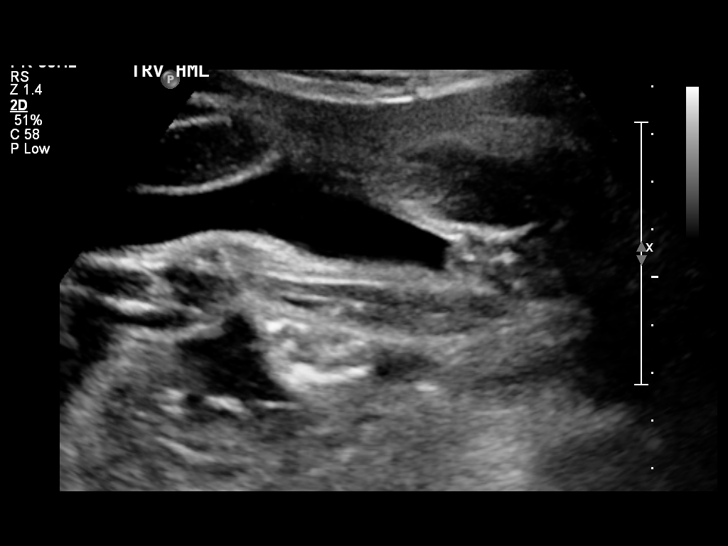
[im 49/70]
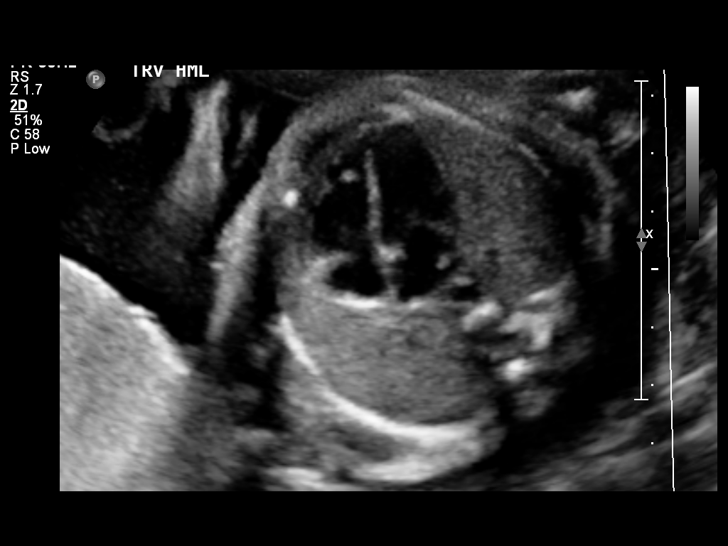
[im 57/70]
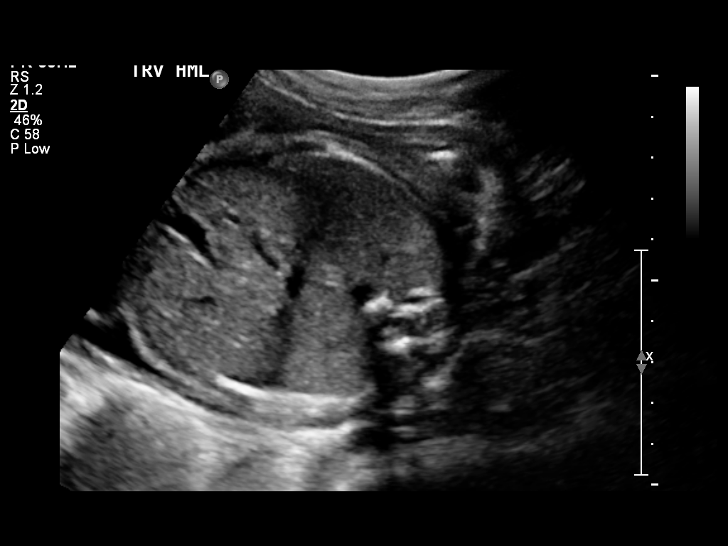
[im 62/70]
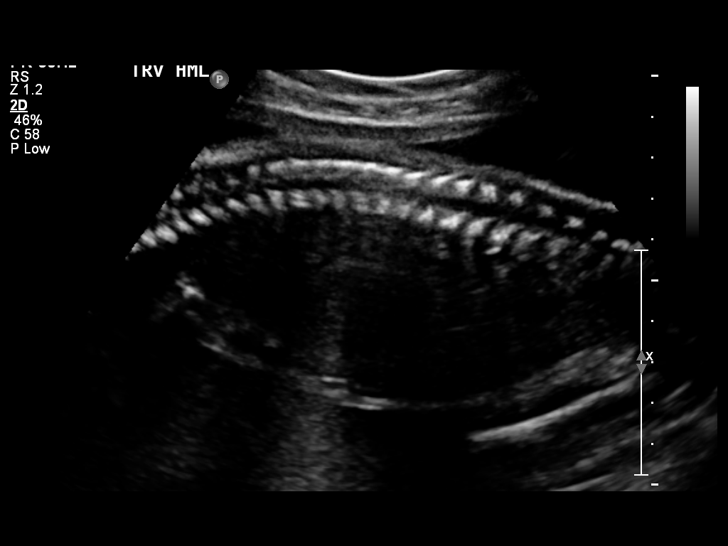
[im 67/70]
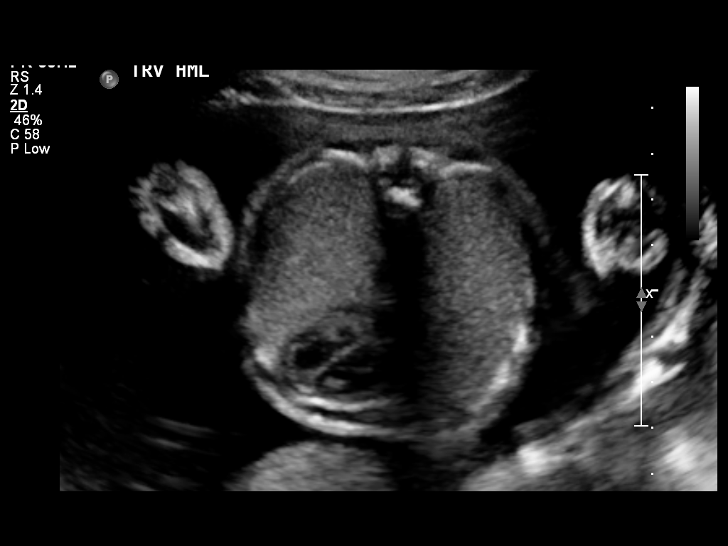

[12 of 28 positions shown; findings below may reference images not displayed]

OBSTETRICS REPORT
                      (Signed Final 07/05/2013 [DATE])

             [facility]

Service(s) Provided

 US OB FOLLOW UP                                       76816.1
Indications

 Follow-up incomplete fetal anatomic evaluation
 Molar pregnancy - previously                          630
 Positive PPD - treated
 NIPS - normal female
Fetal Evaluation

 Num Of Fetuses:    1
 Fetal Heart Rate:  144                          bpm
 Cardiac Activity:  Observed
 Presentation:      Transverse, head to
                    maternal left
 Placenta:          Posterior, above cervical
                    os
 P. Cord            Visualized, central
 Insertion:

 Amniotic Fluid
 AFI FV:      Subjectively within normal limits
                                             Larg Pckt:    6.47  cm
Biometry

 BPD:     61.5  mm     G. Age:  25w 0d                CI:        66.75   70 - 86
                                                      FL/HC:      19.6   18.6 -

 HC:     241.2  mm     G. Age:  26w 2d       44  %    HC/AC:      1.07   1.04 -

 AC:     225.4  mm     G. Age:  27w 0d       78  %    FL/BPD:     76.7   71 - 87
 FL:      47.2  mm     G. Age:  25w 5d       38  %    FL/AC:      20.9   20 - 24
 HUM:     43.1  mm     G. Age:  25w 5d       46  %

 Est. FW:     922  gm      2 lb 1 oz     66  %
Gestational Age

 LMP:           26w 3d        Date:  01/01/13                 EDD:   10/08/13
 U/S Today:     26w 0d                                        EDD:   10/11/13
 Best:          25w 5d     Det. By:  Early Ultrasound         EDD:   10/13/13
                                     (03/14/13)
Anatomy

 Cranium:          Appears normal         Aortic Arch:      Appears normal
 Fetal Cavum:      Previously seen        Ductal Arch:      Not well visualized
 Ventricles:       Appears normal         Diaphragm:        Previously seen
 Choroid Plexus:   Previously seen        Stomach:          Appears normal, left
                                                            sided
 Cerebellum:       Previously seen        Abdomen:          Previously seen
 Posterior Fossa:  Previously seen        Abdominal Wall:   Previously seen
 Nuchal Fold:      Previously seen        Cord Vessels:     Previously seen
 Face:             Profile appears        Kidneys:          Appear normal
                   normal
 Lips:             Appears normal         Bladder:          Appears normal
 Heart:            Appears normal         Spine:            Appears normal
                   (4CH, axis, and
                   situs)
 RVOT:             Appears normal         Lower             Previously seen
                                          Extremities:
 LVOT:             Appears normal         Upper             Previously seen
                                          Extremities:

 Other:  Heels and 5th digit previously visualized. Nasal bone previously
         visualized. Technically difficult due to fetal position.
Cervix Uterus Adnexa

 Cervical Length:    3.09     cm

 Cervix:       Normal appearance by transabdominal scan.
 Uterus:       No abnormality visualized.
 Cul De Sac:   No free fluid seen.
 Left Ovary:    Not visualized.
 Right Ovary:   Not visualized.
 Adnexa:     No abnormality visualized.
Impression

 Single IUP at 25w 5d
 Normal interval anatomy
 Fetal growth is appropriate (66th %tile)
 Posterior placenta without previa
 Normal amniotic fluid volume
Recommendations

 Follow-up ultrasounds as clinically indicated.

## 2017-11-21 ENCOUNTER — Ambulatory Visit (INDEPENDENT_AMBULATORY_CARE_PROVIDER_SITE_OTHER): Payer: BLUE CROSS/BLUE SHIELD | Admitting: Obstetrics & Gynecology

## 2017-11-21 ENCOUNTER — Encounter: Payer: Self-pay | Admitting: Obstetrics & Gynecology

## 2017-11-21 VITALS — BP 128/75 | HR 75 | Resp 16 | Ht 67.0 in | Wt 159.0 lb

## 2017-11-21 DIAGNOSIS — Z23 Encounter for immunization: Secondary | ICD-10-CM | POA: Diagnosis not present

## 2017-11-21 DIAGNOSIS — Z01419 Encounter for gynecological examination (general) (routine) without abnormal findings: Secondary | ICD-10-CM | POA: Diagnosis not present

## 2017-11-21 DIAGNOSIS — R5382 Chronic fatigue, unspecified: Secondary | ICD-10-CM | POA: Diagnosis not present

## 2017-11-21 DIAGNOSIS — Z124 Encounter for screening for malignant neoplasm of cervix: Secondary | ICD-10-CM

## 2017-11-21 DIAGNOSIS — Z1151 Encounter for screening for human papillomavirus (HPV): Secondary | ICD-10-CM | POA: Diagnosis not present

## 2017-11-22 LAB — CBC
HCT: 41 % (ref 35.0–45.0)
HEMOGLOBIN: 13.8 g/dL (ref 11.7–15.5)
MCH: 31.5 pg (ref 27.0–33.0)
MCHC: 33.7 g/dL (ref 32.0–36.0)
MCV: 93.6 fL (ref 80.0–100.0)
MPV: 10.7 fL (ref 7.5–12.5)
PLATELETS: 188 10*3/uL (ref 140–400)
RBC: 4.38 10*6/uL (ref 3.80–5.10)
RDW: 11.7 % (ref 11.0–15.0)
WBC: 4.8 10*3/uL (ref 3.8–10.8)

## 2017-11-22 LAB — TSH: TSH: 1.28 mIU/L

## 2017-11-24 LAB — CYTOLOGY - PAP
Diagnosis: UNDETERMINED — AB
HPV (WINDOPATH): NOT DETECTED

## 2017-11-25 NOTE — Progress Notes (Signed)
Subjective:     Tessa Seaberry Deist is a 37 y.o. female here for a routine exam.  Current complaints: post coital spotting and decreased libido.  Pt has IUD in place.    Gynecologic History Patient's last menstrual period was 11/21/2017. Contraception: IUD Last Pap: 2015. Results were: normal  Obstetric History OB History  Gravida Para Term Preterm AB Living  4 3 3   1 3   SAB TAB Ectopic Multiple Live Births  0       3    # Outcome Date GA Lbr Len/2nd Weight Sex Delivery Anes PTL Lv  4 Term 10/14/13 [redacted]w[redacted]d 00:39 / 00:12 8 lb 2 oz (3.685 kg) F Vag-Spont EPI  LIV  3 Term 12/31/11 [redacted]w[redacted]d 01:06 / 00:12 7 lb 4.4 oz (3.3 kg) F Vag-Spont EPI  LIV     Birth Comments: na  2 Molar 06/22/10          1 Term 02/11/09 [redacted]w[redacted]d  7 lb 13 oz (3.544 kg) F Vag-Spont EPI  LIV     The following portions of the patient's history were reviewed and updated as appropriate: allergies, current medications, past family history, past medical history, past social history, past surgical history and problem list.  Review of Systems Pertinent items noted in HPI and remainder of comprehensive ROS otherwise negative.    Objective:      Vitals:   11/21/17 0912  BP: 128/75  Pulse: 75  Resp: 16  Weight: 159 lb (72.1 kg)  Height: 5\' 7"  (1.702 m)   Vitals:  WNL General appearance: alert, cooperative and no distress  HEENT: Normocephalic, without obvious abnormality, atraumatic Eyes: negative Throat: lips, mucosa, and tongue normal; teeth and gums normal  Respiratory: Clear to auscultation bilaterally  CV: Regular rate and rhythm  Breasts:  Normal appearance, no masses or tenderness, no nipple retraction or dimpling  GI: Soft, non-tender; bowel sounds normal; no masses,  no organomegaly  GU: External Genitalia:  Tanner V, no lesion Urethra:  No prolapse   Vagina: Pink, normal rugae, no blood or discharge  Cervix: No CMT, friable ectropion  Uterus:  Normal size and contour, non tender  Adnexa: Normal,  no masses, non tender  Musculoskeletal: No edema, redness or tenderness in the calves or thighs  Skin: No lesions or rash  Lymphatic: Axillary adenopathy: none     Psychiatric: Normal mood and behavior        Assessment:    Healthy female exam.   Friable Ectropion causing post coital bleeding, intermenstrual spotting Decreased kibido   Plan:   Pap with co-testing Offerred TVUS to eval uterine lining for possible causes of intermenstrual spotting as well as cryo of ectropion.  Pt will consider and schedule if she would like to do this.  IUD is scheduled for removal in 2020.  I suggest she wait and do testing / treatment after IUD removal and before another is placed. Pt encouraged to plan dates with spouse, get lock on bedroom door to decrease fear of children entering bedroom.  Counseling is always beneficial to explore etiologies for decreased libido.

## 2018-03-02 ENCOUNTER — Other Ambulatory Visit: Payer: Self-pay | Admitting: *Deleted

## 2018-03-02 DIAGNOSIS — N939 Abnormal uterine and vaginal bleeding, unspecified: Secondary | ICD-10-CM

## 2018-03-20 ENCOUNTER — Encounter: Payer: Self-pay | Admitting: Obstetrics & Gynecology

## 2018-03-20 ENCOUNTER — Ambulatory Visit (INDEPENDENT_AMBULATORY_CARE_PROVIDER_SITE_OTHER): Payer: BLUE CROSS/BLUE SHIELD | Admitting: Obstetrics & Gynecology

## 2018-03-20 ENCOUNTER — Ambulatory Visit (INDEPENDENT_AMBULATORY_CARE_PROVIDER_SITE_OTHER): Payer: BLUE CROSS/BLUE SHIELD

## 2018-03-20 VITALS — BP 130/79 | HR 72 | Resp 16 | Ht 67.0 in | Wt 163.0 lb

## 2018-03-20 DIAGNOSIS — N83201 Unspecified ovarian cyst, right side: Secondary | ICD-10-CM

## 2018-03-20 DIAGNOSIS — R55 Syncope and collapse: Secondary | ICD-10-CM

## 2018-03-20 DIAGNOSIS — N86 Erosion and ectropion of cervix uteri: Secondary | ICD-10-CM

## 2018-03-20 DIAGNOSIS — N939 Abnormal uterine and vaginal bleeding, unspecified: Secondary | ICD-10-CM | POA: Diagnosis not present

## 2018-03-20 DIAGNOSIS — Z30432 Encounter for removal of intrauterine contraceptive device: Secondary | ICD-10-CM

## 2018-03-20 NOTE — Progress Notes (Signed)
   Subjective:    Patient ID: Danielle Vincent, female    DOB: 05/11/1980, 38 y.o.   MRN: [id number]  HPI Goes by Danielle Vincent Pt presents for f/u of intermenstrual spotting and IUD removal.  Pt bleeds after sex and the thruster squat moves when working out. No bleeding otherwise.  Pt to have IUD out today.  Pt also has TVUS to evaluate lining  Review of Systems  Constitutional: Negative.   Respiratory: Negative.   Cardiovascular: Negative.   Genitourinary: Positive for vaginal bleeding.       Objective:   Physical Exam Vitals signs reviewed. Exam conducted with a chaperone present.  Constitutional:      General: She is not in acute distress.    Appearance: She is well-developed.  HENT:     Head: Normocephalic and atraumatic.  Eyes:     Conjunctiva/sclera: Conjunctivae normal.  Cardiovascular:     Rate and Rhythm: Normal rate.  Pulmonary:     Effort: Pulmonary effort is normal.  Genitourinary:    Comments: Bleeding from cervical ectropion Skin:    General: Skin is warm and dry.  Neurological:     Mental Status: She is alert and oriented to person, place, and time.       Assessment & Plan:  38 yo female with spotting after sex and certain workouts.  IUD removal  Bleeding from cervix noted--friable ectropion. Silver nitrate used to coagulate edge of ectropion  IUD removed easily.  Pt had vasovagal response and given cold cloths and drank Sprite.  If no bleeding after sex or weight lifting--will return for IUD insertion.  If still having post coital bleeding or bleeding with weight lifting--cryo of cervix.  Will also review TVUS results.    25 minutes spent face to face with >50% counseling.

## 2018-12-18 ENCOUNTER — Encounter

## 2019-03-24 ENCOUNTER — Ambulatory Visit: Payer: Self-pay | Attending: Internal Medicine

## 2019-03-24 DIAGNOSIS — Z23 Encounter for immunization: Secondary | ICD-10-CM | POA: Insufficient documentation

## 2019-03-24 NOTE — Progress Notes (Signed)
   Covid-19 Vaccination Clinic  Name:  Taliah Thulin    MRN: [id number] DOB: Mar 06, 1980  03/24/2019  Ms. Milby was observed post Covid-19 immunization for 15 minutes without incident. She was provided with Vaccine Information Sheet and instruction to access the V-Safe system.   Ms. Valiant was instructed to call 911 with any severe reactions post vaccine: Marland Kitchen Difficulty breathing  . Swelling of face and throat  . A fast heartbeat  . A bad rash all over body  . Dizziness and weakness   Immunizations Administered    Name Date Dose VIS Date Route   Pfizer COVID-19 Vaccine 03/24/2019 12:25 PM 0.3 mL 12/29/2018 Intramuscular   Manufacturer: [place]   Lot: [id number]   [place]: [id number]

## 2019-04-14 ENCOUNTER — Ambulatory Visit: Payer: Self-pay | Attending: Internal Medicine

## 2019-04-14 DIAGNOSIS — Z23 Encounter for immunization: Secondary | ICD-10-CM

## 2019-04-14 NOTE — Progress Notes (Signed)
   Covid-19 Vaccination Clinic  Name:  Lissete Rooker    MRN: [id number] DOB: [birth year]  04/14/2019  Ms. Tibbetts was observed post Covid-19 immunization for 15 minutes without incident. She was provided with Vaccine Information Sheet and instruction to access the V-Safe system.   Ms. Chai was instructed to call 911 with any severe reactions post vaccine: Marland Kitchen Difficulty breathing  . Swelling of face and throat  . A fast heartbeat  . A bad rash all over body  . Dizziness and weakness   Immunizations Administered    Name Date Dose VIS Date Route   Pfizer COVID-19 Vaccine 04/14/2019  9:08 AM 0.3 mL 12/29/2018 Intramuscular   Manufacturer: [place]   Lot: [id number]   [place]: [id number]

## 2019-06-30 DIAGNOSIS — G8911 Acute pain due to trauma: Secondary | ICD-10-CM | POA: Diagnosis not present

## 2019-06-30 DIAGNOSIS — R269 Unspecified abnormalities of gait and mobility: Secondary | ICD-10-CM | POA: Diagnosis not present

## 2019-06-30 DIAGNOSIS — M533 Sacrococcygeal disorders, not elsewhere classified: Secondary | ICD-10-CM | POA: Diagnosis not present

## 2019-07-27 ENCOUNTER — Ambulatory Visit (INDEPENDENT_AMBULATORY_CARE_PROVIDER_SITE_OTHER): Payer: BC Managed Care – PPO | Admitting: Certified Nurse Midwife

## 2019-07-27 ENCOUNTER — Other Ambulatory Visit: Payer: Self-pay

## 2019-07-27 ENCOUNTER — Encounter: Payer: Self-pay | Admitting: Certified Nurse Midwife

## 2019-07-27 VITALS — BP 123/73 | HR 76 | Resp 16 | Ht 70.0 in | Wt 162.0 lb

## 2019-07-27 DIAGNOSIS — N926 Irregular menstruation, unspecified: Secondary | ICD-10-CM | POA: Diagnosis not present

## 2019-07-27 DIAGNOSIS — Z01419 Encounter for gynecological examination (general) (routine) without abnormal findings: Secondary | ICD-10-CM

## 2019-07-27 NOTE — Progress Notes (Signed)
Gynecology Annual Exam   History of Present Illness: Danielle Vincent is a 39 y.o. married female presenting for an annual exam. She has complains of continued intermenstrual bleeding. Has bleeding most days, requires panty liner. Disruptive to life and sexual activity. Has tried IUD twice in past, continued to bleed. Using condoms consistently. Does not desire anymore pregnancies. She denies dyspareunia. She does perform self breast exams. There is no notable family history of breast or ovarian cancer in her family.   Past Medical History:  Past Medical History:  Diagnosis Date  . Molar pregnancy 2012  . Positive PPD, treated   . Supervision of other normal pregnancy 10/04/2011    Genetic Screen Neg [place], XX Anatomic [place] Normal female Glucose Screen  GBS  Feeding Preference Breast Contraception  Circumcision n/a    . Thrombocytopenia complicating pregnancy [facility])     Past Surgical History:  Past Surgical History:  Procedure Laterality Date  . DILATION AND CURETTAGE OF UTERUS    . KNEE SURGERY  1995    Gynecologic History:  LMP: No LMP recorded. (Menstrual status: Irregular Periods). Average Interval: irregular Heavy Menses: no Clots: no Intermenstrual Bleeding: yes Postcoital Bleeding: yes Contraception: condoms Last Pap: completed on 11/21/17 ; result was: ASCUS with NEGATIVE high risk HPV  Mammogram: none  Obstetric History: [id number]  Family History:  Family History  Problem Relation Age of Onset  . Cancer Maternal Grandmother        lung  . Diabetes Maternal Grandmother   . Hyperlipidemia Maternal Grandmother   . Osteoporosis Maternal Grandmother   . Diabetes Paternal Grandmother   . Diabetes Paternal Grandfather   . Hyperlipidemia Mother     Social History:  Social History   Socioeconomic History  . Marital status: Married    Spouse name: Not on file  . Number of children: Not on file  . Years of education: Not on file  . Highest education level: Not  on file  Occupational History  . Occupation: Medical illustrator  Tobacco Use  . Smoking status: Never Smoker  . Smokeless tobacco: Never Used  Vaping Use  . Vaping Use: Never used  Substance and Sexual Activity  . Alcohol use: No  . Drug use: No  . Sexual activity: Yes    Birth control/protection: None    Comment: nuvaring  Other Topics Concern  . Not on file  Social History Narrative  . Not on file   Social Determinants of Health   Financial Resource Strain:   . Difficulty of Paying Living Expenses:   Food Insecurity:   . Worried About Charity fundraiser in the Last Year:   . Arboriculturist in the Last Year:   Transportation Needs:   . Film/video editor (Medical):   Marland Kitchen Lack of Transportation (Non-Medical):   Physical Activity:   . Days of Exercise per Week:   . Minutes of Exercise per Session:   Stress:   . Feeling of Stress :   Social Connections:   . Frequency of Communication with Friends and Family:   . Frequency of Social Gatherings with Friends and Family:   . Attends Religious Services:   . Active Member of Clubs or Organizations:   . Attends Archivist Meetings:   Marland Kitchen Marital Status:   Intimate Partner Violence:   . Fear of Current or Ex-Partner:   . Emotionally Abused:   Marland Kitchen Physically Abused:   . Sexually Abused:     Allergies:  No Known Allergies  Medications: Prior to Admission medications   Medication Sig Start Date End Date Taking? Authorizing Provider  ibuprofen (ADVIL,MOTRIN) 600 MG tablet Take 1 tablet (600 mg total) by mouth every 6 (six) hours. 10/16/13   Josephine Cables, MD    Review of Systems: negative except noted in HPI  Physical Exam Vitals: BP 123/73   Pulse 76   Resp 16   Ht 5\' 10"  (1.778 m)   Wt 162 lb (73.5 kg)   BMI 23.24 kg/m  General: NAD HEENT: normocephalic, atraumatic Thyroid: no enlargement, no palpable nodules Pulmonary: Normal rate and effort, CTAB Cardiovascular: RRR Breast: Breast symmetrical,  no tenderness, no palpable nodules or masses, no skin or nipple retraction present, no nipple discharge. No axillary or supraclavicular lymphadenopathy. Abdomen: soft, non-tender, non-distended. No hepatomegaly, splenomegaly or masses palpable. No evidence of hernia  Genitourinary:  External: Normal external female genitalia. Normal urethral meatus  Uterus: Non-enlarged, mobile, normal contour. No CMT  Adnexa: non-enlarged, no masses  Rectal: deferred Extremities: no edema, erythema, or tenderness Neurologic: Grossly intact Psychiatric: mood appropriate, affect full  Female chaperone present for pelvic and breast portions of the physical exam  Assessment:  1. Irregular bleeding   2. Well woman exam    Plan: Urine sample not collected prior to pt departure, low suspicion for pregnancy but she will will take home test this weekend Repeat pelvic US next week- [place] in 03/2018 showed small hemorrhagic cyst, normal endo, and ?adenomyosis Consider EBX Follow up with MD after imaging for management options; previous provider mentioned bleeding may be from ectropion and option of cryo of cervix Schedule screening mammogram  Julianne Handler, CNM 07/27/2019 10:32 AM

## 2019-08-03 ENCOUNTER — Ambulatory Visit (HOSPITAL_BASED_OUTPATIENT_CLINIC_OR_DEPARTMENT_OTHER): Payer: BC Managed Care – PPO

## 2019-08-15 ENCOUNTER — Other Ambulatory Visit: Payer: Self-pay

## 2019-08-15 ENCOUNTER — Ambulatory Visit (HOSPITAL_BASED_OUTPATIENT_CLINIC_OR_DEPARTMENT_OTHER)
Admission: RE | Admit: 2019-08-15 | Discharge: 2019-08-15 | Disposition: A | Discharge status: Home / Self Care | Payer: BC Managed Care – PPO | Source: Ambulatory Visit | Attending: Certified Nurse Midwife | Admitting: Certified Nurse Midwife

## 2019-08-15 DIAGNOSIS — N926 Irregular menstruation, unspecified: Secondary | ICD-10-CM | POA: Diagnosis not present

## 2019-08-15 DIAGNOSIS — N888 Other specified noninflammatory disorders of cervix uteri: Secondary | ICD-10-CM | POA: Diagnosis not present

## 2019-08-15 DIAGNOSIS — Z1231 Encounter for screening mammogram for malignant neoplasm of breast: Secondary | ICD-10-CM | POA: Diagnosis not present

## 2019-08-15 DIAGNOSIS — N939 Abnormal uterine and vaginal bleeding, unspecified: Secondary | ICD-10-CM | POA: Diagnosis not present

## 2019-08-20 ENCOUNTER — Ambulatory Visit (INDEPENDENT_AMBULATORY_CARE_PROVIDER_SITE_OTHER): Payer: BC Managed Care – PPO | Admitting: Obstetrics & Gynecology

## 2019-08-20 ENCOUNTER — Other Ambulatory Visit: Payer: Self-pay | Admitting: Obstetrics & Gynecology

## 2019-08-20 ENCOUNTER — Encounter: Payer: Self-pay | Admitting: Obstetrics & Gynecology

## 2019-08-20 ENCOUNTER — Telehealth: Payer: Self-pay

## 2019-08-20 ENCOUNTER — Other Ambulatory Visit: Payer: Self-pay

## 2019-08-20 VITALS — BP 132/85 | HR 81 | Resp 16 | Ht 70.0 in | Wt 164.0 lb

## 2019-08-20 DIAGNOSIS — N938 Other specified abnormal uterine and vaginal bleeding: Secondary | ICD-10-CM

## 2019-08-20 DIAGNOSIS — F419 Anxiety disorder, unspecified: Secondary | ICD-10-CM

## 2019-08-20 DIAGNOSIS — Z01812 Encounter for preprocedural laboratory examination: Secondary | ICD-10-CM | POA: Diagnosis not present

## 2019-08-20 DIAGNOSIS — N921 Excessive and frequent menstruation with irregular cycle: Secondary | ICD-10-CM | POA: Diagnosis not present

## 2019-08-20 LAB — POCT URINE PREGNANCY: Preg Test, Ur: NEGATIVE

## 2019-08-20 MED ORDER — ALPRAZOLAM 0.5 MG PO TABS: ORAL_TABLET | ORAL | 0 refills | Status: DC

## 2019-08-20 NOTE — Telephone Encounter (Addendum)
Pt called requesting Rx for Xanax be switched to a different pharmacy. I attempted to change Rx to new pharmacy but, it will only print Rx. I canceled the order I had placed for new pharmacy and discontinued Rx. Pt will go to original pharmacy for Rx.

## 2019-08-20 NOTE — Progress Notes (Signed)
Pt having gyn procedure and requesting medication for anxiety.

## 2019-08-22 DIAGNOSIS — N921 Excessive and frequent menstruation with irregular cycle: Secondary | ICD-10-CM | POA: Insufficient documentation

## 2019-08-22 NOTE — Progress Notes (Signed)
Subjective:    Patient ID: Danielle Vincent, female    DOB: July 02, 1980, 39 y.o.   MRN: [id number]  HPI  39 year old female presents for continued bleeding.  Patient has had bleeding both with the Mirena and without the Mirena.  She has had post coital spotting.  She would like definitive treatment for the bleeding.  It is interfering with her sex life.  She had a normal transvaginal ultrasound on August 15, 2019.  Results are below.  Patient is here for endometrial biopsy today.    TECHNIQUE: Both transabdominal and transvaginal ultrasound examinations of the pelvis were performed. Transabdominal technique was performed for global imaging of the pelvis including uterus, ovaries, adnexal regions, and pelvic cul-de-sac. It was necessary to proceed with endovaginal exam following the transabdominal exam to visualize the lower uterine segment and cervix.  COMPARISON:  03/20/2018  FINDINGS: Uterus  Measurements: 8.4 x 4.8 x 6.5 cm = volume: 138 mL. Anteverted. Nabothian cysts at cervix. Heterogeneous myometrial echogenicity without focal mass.  Endometrium  Thickness: 10 mm.  No endometrial fluid or focal abnormality.  Right ovary  Measurements: 2.9 x 2.4 x 2.0 cm = volume: 7.5 mL. Normal morphology without mass  Left ovary  Measurements: 1.9 x 2.1 x 1.4 cm = 2.9 mL. Seen only on transabdominal imaging. Normal morphology without mass.  Other findings  No free pelvic fluid or adnexal masses.  IMPRESSION: No pelvic sonographic abnormalities identified.   Electronically Signed   By: Lavonia Dana M.D.   On: 08/15/2019 14:06  Review of Systems  Constitutional: Negative.   Respiratory: Negative.   Cardiovascular: Negative.   Gastrointestinal: Negative.   Genitourinary: Positive for menstrual problem and vaginal bleeding. Negative for vaginal discharge and vaginal pain.  Psychiatric/Behavioral: Negative.        Objective:   Physical Exam Vitals  reviewed.  Constitutional:      General: She is not in acute distress.    Appearance: She is well-developed.  HENT:     Head: Normocephalic and atraumatic.  Eyes:     Conjunctiva/sclera: Conjunctivae normal.  Cardiovascular:     Rate and Rhythm: Normal rate.  Pulmonary:     Effort: Pulmonary effort is normal.  Abdominal:     General: Abdomen is flat.     Palpations: Abdomen is soft.     Tenderness: There is no abdominal tenderness.  Genitourinary:    Comments: Tanner V Vulva:  No lesion Vagina:  Pink, no lesions, no discharge, no blood Cervix:  No CMT, bleeding from os and from ectocervix (around 6 o'clock) Uterus:  Non tender, mobile Right adnexa--non tender, no mass Left adnexa--non tender, no mass  Skin:    General: Skin is warm and dry.  Neurological:     Mental Status: She is alert and oriented to person, place, and time.    Vitals:   08/20/19 1410  BP: 132/85  Pulse: 81  Resp: 16  Weight: 164 lb (74.4 kg)  Height: 5\' 10"  (1.778 m)      Assessment & Plan:  39 year old female with menometrorrhagia and bleeding on the ectocervix.  1.  ENDOMETRIAL BIOPSY     The indications for endometrial biopsy were reviewed.   Risks of the biopsy including cramping, bleeding, infection, uterine perforation, inadequate specimen and need for additional procedures  were discussed. The patient states she understands and agrees to undergo procedure today. Consent was signed. Time out was performed. Urine HCG was negative. A sterile speculum was placed in the patient's  vagina and the cervix was prepped with Betadine. A single-toothed tenaculum was placed on the anterior lip of the cervix to stabilize it. The 3 mm pipelle was introduced into the endometrial cavity without difficulty to a depth of 8 cm, and a moderate amount of tissue was obtained and sent to pathology. The instruments were removed from the patient's vagina. Minimal bleeding from the cervix was noted. The patient tolerated  the procedure well. Routine post-procedure instructions were given to the patient. The patient will follow up to review the results and for further management.    2.  Patient will meet with Dr. Rosana Hoes later this month to discuss surgical management of bleeding.

## 2019-08-30 ENCOUNTER — Ambulatory Visit (INDEPENDENT_AMBULATORY_CARE_PROVIDER_SITE_OTHER): Payer: BC Managed Care – PPO | Admitting: Obstetrics and Gynecology

## 2019-08-30 ENCOUNTER — Other Ambulatory Visit: Payer: Self-pay

## 2019-08-30 ENCOUNTER — Encounter: Payer: Self-pay | Admitting: Obstetrics and Gynecology

## 2019-08-30 ENCOUNTER — Encounter: Payer: Self-pay | Admitting: *Deleted

## 2019-08-30 VITALS — BP 135/82 | HR 91 | Resp 16 | Ht 70.0 in | Wt 164.0 lb

## 2019-08-30 DIAGNOSIS — N939 Abnormal uterine and vaginal bleeding, unspecified: Secondary | ICD-10-CM

## 2019-08-30 NOTE — Progress Notes (Signed)
GYNECOLOGY OFFICE FOLLOW UP NOTE  History:  39 y.o. [id number] here today for follow up for bleeding.  Bleeding for 9-10 years, since molar pregnancy. Bleeds all the time, sometimes it is light, sometimes it is heavy. Always bleeds after sex, usually up to a week. Cannot tell difference between bleeding and periods. Has had IUD twice with no improvement in bleeding. Very frustrated and ready for management.  Past Medical History:  Diagnosis Date  . Molar pregnancy 2012  . Positive PPD, treated   . Supervision of other normal pregnancy 10/04/2011    Genetic Screen Neg [place], XX Anatomic [place] Normal female Glucose Screen  GBS  Feeding Preference Breast Contraception  Circumcision n/a    . Thrombocytopenia complicating pregnancy [facility])     Past Surgical History:  Procedure Laterality Date  . DILATION AND CURETTAGE OF UTERUS    . KNEE SURGERY  1995     Current Outpatient Medications:  .  ibuprofen (ADVIL,MOTRIN) 600 MG tablet, Take 1 tablet (600 mg total) by mouth every 6 (six) hours., Disp: 30 tablet, Rfl: 0  The following portions of the patient's history were reviewed and updated as appropriate: allergies, current medications, past family history, past medical history, past social history, past surgical history and problem list.   Review of Systems:  Pertinent items noted in HPI and remainder of comprehensive ROS otherwise negative.   Objective:  Physical Exam BP 135/82   Pulse 91   Resp 16   Ht 5\' 10"  (1.778 m)   Wt 164 lb (74.4 kg)   LMP 08/18/2019   BMI 23.53 kg/m  CONSTITUTIONAL: Well-developed, well-nourished female in no acute distress.  HENT:  Normocephalic, atraumatic. External right and left ear normal. Oropharynx is clear and moist EYES: Conjunctivae and EOM are normal. Pupils are equal, round, and reactive to light. No scleral icterus.  NECK: Normal range of motion, supple, no masses SKIN: Skin is warm and dry. No rash noted. Not diaphoretic. No erythema. No  pallor. NEUROLOGIC: Alert and oriented to person, place, and time. Normal reflexes, muscle tone coordination. No cranial nerve deficit noted. PSYCHIATRIC: Normal mood and affect. Normal behavior. Normal judgment and thought content. CARDIOVASCULAR: Normal heart rate noted RESPIRATORY: Effort normal, no problems with respiration noted ABDOMEN: Soft, no distention noted.   PELVIC: deferred MUSCULOSKELETAL: Normal range of motion. No edema noted.  Labs and Imaging MM 3D SCREEN BREAST BILATERAL  Result Date: 08/17/2019 CLINICAL DATA:  Screening. EXAM: DIGITAL SCREENING BILATERAL MAMMOGRAM WITH TOMO AND CAD COMPARISON:  Previous exam(s). ACR Breast Density Category c: The breast tissue is heterogeneously dense, which may obscure small masses. FINDINGS: There are no findings suspicious for malignancy. Images were processed with CAD. IMPRESSION: No mammographic evidence of malignancy. A result letter of this screening mammogram will be mailed directly to the patient. RECOMMENDATION: Screening mammogram in one year. (Code:SM-B-01Y) BI-RADS CATEGORY  1: Negative. Electronically Signed   By: Fidela Salisbury M.D.   On: 08/17/2019 13:24   US PELVIC COMPLETE WITH TRANSVAGINAL  Result Date: 08/15/2019 CLINICAL DATA:  Irregular vaginal bleeding, LMP 08/05/2019 EXAM: TRANSABDOMINAL AND TRANSVAGINAL ULTRASOUND OF PELVIS TECHNIQUE: Both transabdominal and transvaginal ultrasound examinations of the pelvis were performed. Transabdominal technique was performed for global imaging of the pelvis including uterus, ovaries, adnexal regions, and pelvic cul-de-sac. It was necessary to proceed with endovaginal exam following the transabdominal exam to visualize the lower uterine segment and cervix. COMPARISON:  03/20/2018 FINDINGS: Uterus Measurements: 8.4 x 4.8 x 6.5 cm = volume: 138 mL.  Anteverted. Nabothian cysts at cervix. Heterogeneous myometrial echogenicity without focal mass. Endometrium Thickness: 10 mm.  No  endometrial fluid or focal abnormality. Right ovary Measurements: 2.9 x 2.4 x 2.0 cm = volume: 7.5 mL. Normal morphology without mass Left ovary Measurements: 1.9 x 2.1 x 1.4 cm = 2.9 mL. Seen only on transabdominal imaging. Normal morphology without mass. Other findings No free pelvic fluid or adnexal masses. IMPRESSION: No pelvic sonographic abnormalities identified. Electronically Signed   By: Lavonia Dana M.D.   On: 08/15/2019 14:06    Assessment & Plan:   1. Abnormal uterine bleeding (AUB) - Long history of bleeding - Benign EMB - Prior exams show bleeding from cervix - Given abnormal pap and benign EMB, recommend pap/colpo to ascertain and biopsy source of bleeding to rule out occult issue - once this is done, will proceed with likely surgical management as hormonal management would likely not decrease cervical bleeding - patient agreeable to this plan - return for pap/colpo  Routine preventative health maintenance measures emphasized. Please refer to After Visit Summary for other counseling recommendations.   Return in about 1 week (around 09/06/2019) for return for colpo.  Total face-to-face time with patient: 22 minutes. Over 50% of encounter was spent on counseling and coordination of care.  Feliz Beam, M.D. Attending Center for [employer] farm manager)

## 2019-08-31 ENCOUNTER — Encounter: Payer: Self-pay | Admitting: Obstetrics and Gynecology

## 2019-09-14 ENCOUNTER — Encounter: Payer: Self-pay | Admitting: *Deleted

## 2019-09-14 ENCOUNTER — Other Ambulatory Visit: Payer: Self-pay | Admitting: Certified Nurse Midwife

## 2019-09-14 DIAGNOSIS — F411 Generalized anxiety disorder: Secondary | ICD-10-CM

## 2019-09-14 MED ORDER — ALPRAZOLAM 0.5 MG PO TABS: 0.5000 mg | ORAL_TABLET | Freq: Once | ORAL | 0 refills | Status: AC

## 2019-09-17 ENCOUNTER — Ambulatory Visit (INDEPENDENT_AMBULATORY_CARE_PROVIDER_SITE_OTHER): Payer: BC Managed Care – PPO | Admitting: Obstetrics and Gynecology

## 2019-09-17 ENCOUNTER — Other Ambulatory Visit: Payer: Self-pay | Admitting: Obstetrics and Gynecology

## 2019-09-17 ENCOUNTER — Other Ambulatory Visit: Payer: Self-pay

## 2019-09-17 ENCOUNTER — Encounter: Payer: Self-pay | Admitting: Obstetrics and Gynecology

## 2019-09-17 MED ORDER — ALPRAZOLAM 0.5 MG PO TABS
0.5000 mg | ORAL_TABLET | Freq: Every evening | ORAL | 0 refills | Status: DC | PRN
Start: 2019-09-17 — End: 2020-01-17

## 2019-09-17 NOTE — Progress Notes (Signed)
Pre-procedure xanax sent.

## 2019-09-27 ENCOUNTER — Other Ambulatory Visit: Payer: Self-pay

## 2019-09-27 ENCOUNTER — Encounter: Payer: Self-pay | Admitting: Obstetrics and Gynecology

## 2019-09-27 ENCOUNTER — Other Ambulatory Visit: Payer: Self-pay | Admitting: Obstetrics and Gynecology

## 2019-09-27 ENCOUNTER — Ambulatory Visit (INDEPENDENT_AMBULATORY_CARE_PROVIDER_SITE_OTHER): Payer: BC Managed Care – PPO | Admitting: Obstetrics and Gynecology

## 2019-09-27 VITALS — BP 110/72 | HR 76 | Resp 16 | Ht 71.0 in | Wt 166.0 lb

## 2019-09-27 DIAGNOSIS — N939 Abnormal uterine and vaginal bleeding, unspecified: Secondary | ICD-10-CM | POA: Diagnosis not present

## 2019-09-27 DIAGNOSIS — Z01812 Encounter for preprocedural laboratory examination: Secondary | ICD-10-CM | POA: Diagnosis not present

## 2019-09-27 DIAGNOSIS — Z1329 Encounter for screening for other suspected endocrine disorder: Secondary | ICD-10-CM | POA: Diagnosis not present

## 2019-09-27 DIAGNOSIS — R87610 Atypical squamous cells of undetermined significance on cytologic smear of cervix (ASC-US): Secondary | ICD-10-CM

## 2019-09-27 DIAGNOSIS — Z23 Encounter for immunization: Secondary | ICD-10-CM

## 2019-09-27 DIAGNOSIS — N870 Mild cervical dysplasia: Secondary | ICD-10-CM | POA: Diagnosis not present

## 2019-09-27 LAB — POCT URINE PREGNANCY: Preg Test, Ur: NEGATIVE

## 2019-09-27 NOTE — Progress Notes (Signed)
Colposcopy Procedure Note  Danielle Vincent is a 39 y.o. [id number] here for colposcopy.  Indications:  Abnormal bleeding appearing to come from cervix  Procedure Details  LMP 09/20/19; UPT negative.    The risks (including infection, bleeding, pain) and benefits of the procedure were explained to the patient and written informed consent was obtained.  The patient was placed in the dorsal lithotomy position. A Graves was speculum inserted in the vagina, and the cervix was visualized.  The cervix was stained with acetic acid and visualized using the colposcope under magnification as well as with a green filter. Findings as below. Cervical biopsies were taken at 12, 4, 9 o'clock. Endocervical curettage then performed in all four quadrants. Small amount of bleeding noted that improved with pressure. Monsel's solution applied with good hemostasis noted. Patient tolerating procedure well.  Findings: acetowhite at 9 o'clock, friable cervix at 11-12, SCJ easily visible  Impression: low grade  Adequate: yes  Specimens:  1. Cervical biopsy at 12, 4, 9 o'clock 2. Endocervical curettage  Condition: Stable  Complications: None  Plan: The patient was advised to call for any fever or for prolonged or severe pain or bleeding. She was advised to use OTC analgesics as needed for mild to moderate pain. She was advised to avoid vaginal intercourse for 48 hours or until the bleeding has completely stopped.  Will base further management on results of biopsy.   Feliz Beam, M.D. Attending Center for [employer] farm manager)

## 2019-09-28 LAB — TSH: TSH: 1.23 mIU/L

## 2019-09-29 LAB — BETA HCG QUANT (REF LAB): hCG Quant: <1 m[IU]/mL

## 2019-10-18 ENCOUNTER — Ambulatory Visit (INDEPENDENT_AMBULATORY_CARE_PROVIDER_SITE_OTHER): Payer: BC Managed Care – PPO | Admitting: Obstetrics and Gynecology

## 2019-10-18 ENCOUNTER — Other Ambulatory Visit: Payer: Self-pay

## 2019-10-18 ENCOUNTER — Encounter: Payer: Self-pay | Admitting: Obstetrics and Gynecology

## 2019-10-18 VITALS — BP 134/82 | HR 71 | Ht 71.0 in | Wt 165.0 lb

## 2019-10-18 DIAGNOSIS — N939 Abnormal uterine and vaginal bleeding, unspecified: Secondary | ICD-10-CM

## 2019-10-18 DIAGNOSIS — N870 Mild cervical dysplasia: Secondary | ICD-10-CM

## 2019-10-19 ENCOUNTER — Encounter: Payer: Self-pay | Admitting: Obstetrics and Gynecology

## 2019-10-19 NOTE — Progress Notes (Signed)
GYNECOLOGY OFFICE FOLLOW UP NOTE  History:  39 y.o. [id number] here today for follow up for colposcopy results and discussion of next steps.     Past Medical History:  Diagnosis Date  . Molar pregnancy 2012  . Positive PPD, treated   . Supervision of other normal pregnancy 10/04/2011    Genetic Screen Neg [place], XX Anatomic [place] Normal female Glucose Screen  GBS  Feeding Preference Breast Contraception  Circumcision n/a    . Thrombocytopenia complicating pregnancy [facility])     Past Surgical History:  Procedure Laterality Date  . DILATION AND CURETTAGE OF UTERUS    . KNEE SURGERY  1995     Current Outpatient Medications:  .  ALPRAZolam (XANAX) 0.5 MG tablet, Take 1 tablet (0.5 mg total) by mouth at bedtime as needed for anxiety. (Patient not taking: Reported on 10/18/2019), Disp: 2 tablet, Rfl: 0 .  ibuprofen (ADVIL,MOTRIN) 600 MG tablet, Take 1 tablet (600 mg total) by mouth every 6 (six) hours. (Patient not taking: Reported on 10/18/2019), Disp: 30 tablet, Rfl: 0  The following portions of the patient's history were reviewed and updated as appropriate: allergies, current medications, past family history, past medical history, past social history, past surgical history and problem list.   Review of Systems:  Pertinent items noted in HPI and remainder of comprehensive ROS otherwise negative.   Objective:  Physical Exam BP 134/82   Pulse 71   Ht 5\' 11"  (1.803 m)   Wt 165 lb (74.8 kg)   BMI 23.01 kg/m  CONSTITUTIONAL: Well-developed, well-nourished female in no acute distress.  HENT:  Normocephalic, atraumatic. External right and left ear normal. Oropharynx is clear and moist EYES: Conjunctivae and EOM are normal. Pupils are equal, round, and reactive to light. No scleral icterus.  NECK: Normal range of motion, supple, no masses SKIN: Skin is warm and dry. No rash noted. Not diaphoretic. No erythema. No pallor. NEUROLOGIC: Alert and oriented to person, place, and time. Normal  reflexes, muscle tone coordination. No cranial nerve deficit noted. PSYCHIATRIC: Normal mood and affect. Normal behavior. Normal judgment and thought content. CARDIOVASCULAR: Normal heart rate noted RESPIRATORY: Effort normal, no problems with respiration noted ABDOMEN: Soft, no distention noted.   PELVIC: deferred MUSCULOSKELETAL: Normal range of motion. No edema noted.  Labs and Imaging No results found.  Assessment & Plan:   1. Abnormal uterine bleeding (AUB) Pt has long standing history of vaginal bleeding, appears to be coming from cervix. - has had benign EMB, neg HCG (h/o molar pregnancy), and colpo with CIN 1 on biopsy - has had IUD with no improvement in bleeding - desires definitive management as this is affecting her lifestyle significantly - Reviewed recommendation for vaginal hysterectomy, given prior vaginal deliveries and cervical descent. The risks of vaginal hysterectomy were discussed with the patient; including but not limited to: infection which may require antibiotics; bleeding which may require transfusion or re-operation; injury to bowel, bladder, ureters or other surrounding organs; need for additional procedures, conversion to laparotomy, incisional problems, thromboembolic phenomenon and other postoperative/anesthesia complications. Reviewed that with a hysterectomy, she will not be able to have children in the future. Reviewed recommendation to remove fallopian tubes to reduce risk of ovarian cancer, but as she is 55, would not recommend removal of ovaries at this time. Reviewed that ovaries may be removed if they are abnormal appearing, anatomy requires it or if there is damage to blood supply. Reviewed that removal of ovaries would put her into surgical menopause and  we will attempt to avoid this if possible. Reviewed expected post surgery course and hospital stay.  Patient verbalized understanding of the above and consents to blood transfusion in the event of a  life-threatening hemorrhage. Answered all questions.   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 Please refer to After Visit Summary for other counseling recommendations.   Return for no follow up needed at this time.  Total face-to-face time with patient: 25 minutes. Over 50% of encounter was spent on counseling and coordination of care.  Feliz Beam, M.D. Attending Center for [employer] farm manager)

## 2019-11-22 ENCOUNTER — Ambulatory Visit (INDEPENDENT_AMBULATORY_CARE_PROVIDER_SITE_OTHER): Payer: BC Managed Care – PPO | Admitting: Obstetrics and Gynecology

## 2019-11-22 ENCOUNTER — Other Ambulatory Visit: Payer: Self-pay

## 2019-11-22 ENCOUNTER — Encounter: Payer: Self-pay | Admitting: Obstetrics and Gynecology

## 2019-11-22 VITALS — BP 132/82 | HR 77 | Resp 16 | Ht 71.0 in | Wt 167.0 lb

## 2019-11-22 DIAGNOSIS — Z01818 Encounter for other preprocedural examination: Secondary | ICD-10-CM

## 2019-11-22 DIAGNOSIS — N939 Abnormal uterine and vaginal bleeding, unspecified: Secondary | ICD-10-CM

## 2019-11-22 NOTE — Progress Notes (Signed)
GYNECOLOGY OFFICE NOTE  History:  39 y.o. [id number] here today for pre-op eval and discussion for hysterectomy for ongoing abnormal uterine bleeding. She desires definitive management. No acute changes.    Past Medical History:  Diagnosis Date  . Molar pregnancy 2012  . Positive PPD, treated   . Supervision of other normal pregnancy 10/04/2011    Genetic Screen Neg [place], XX Anatomic [place] Normal female Glucose Screen  GBS  Feeding Preference Breast Contraception  Circumcision n/a    . Thrombocytopenia complicating pregnancy [facility])     Past Surgical History:  Procedure Laterality Date  . DILATION AND CURETTAGE OF UTERUS    . KNEE SURGERY  1995     Current Outpatient Medications:  .  ALPRAZolam (XANAX) 0.5 MG tablet, Take 1 tablet (0.5 mg total) by mouth at bedtime as needed for anxiety. (Patient not taking: Reported on 10/18/2019), Disp: 2 tablet, Rfl: 0 .  ibuprofen (ADVIL,MOTRIN) 600 MG tablet, Take 1 tablet (600 mg total) by mouth every 6 (six) hours. (Patient not taking: Reported on 10/18/2019), Disp: 30 tablet, Rfl: 0  The following portions of the patient's history were reviewed and updated as appropriate: allergies, current medications, past family history, past medical history, past social history, past surgical history and problem list.   Review of Systems:  Pertinent items noted in HPI and remainder of comprehensive ROS otherwise negative.   Objective:  Physical Exam BP 132/82   Pulse 77   Resp 16   Ht 5\' 11"  (1.803 m)   Wt 167 lb (75.8 kg)   LMP 11/08/2019   BMI 23.29 kg/m  CONSTITUTIONAL: Well-developed, well-nourished female in no acute distress.  HENT:  Normocephalic, atraumatic. External right and left ear normal. Oropharynx is clear and moist EYES: Conjunctivae and EOM are normal. Pupils are equal, round, and reactive to light. No scleral icterus.  NECK: Normal range of motion, supple, no masses SKIN: Skin is warm and dry. No rash noted. Not diaphoretic. No  erythema. No pallor. NEUROLOGIC: Alert and oriented to person, place, and time. Normal reflexes, muscle tone coordination. No cranial nerve deficit noted. PSYCHIATRIC: Normal mood and affect. Normal behavior. Normal judgment and thought content. CARDIOVASCULAR: Normal heart rate noted RESPIRATORY: Effort normal, no problems with respiration noted ABDOMEN: Soft, no distention noted.   PELVIC: deferred MUSCULOSKELETAL: Normal range of motion. No edema noted.   Labs and Imaging No results found.  Assessment & Plan:  1. Abnormal uterine bleeding (AUB) Pt with ongoing vaginal bleeding, has tried multiple methods of control with no improvement - desires definitive management - Reviewed recommendation for vaginal hysterectomy, given h/o vaginal delivery and descent of uterus. The risks of vaginal hysterectomy were discussed with the patient; including but not limited to: infection which may require antibiotics; bleeding which may require transfusion or re-operation; injury to bowel, bladder, ureters or other surrounding organs; need for additional procedures, conversion to laparotomy, incisional problems, thromboembolic phenomenon and other postoperative/anesthesia complications. Reviewed that with a hysterectomy, she will not be able to have children in the future. Reviewed recommendation to remove fallopian tubes to reduce risk of ovarian cancer, but as she is 76, would not recommend removal of ovaries at this time. Reviewed that ovaries may be removed if they are abnormal appearing, anatomy requires it or if there is damage to blood supply. Reviewed that removal of ovaries would put her into surgical menopause and we will attempt to avoid this if possible. Reviewed expected post surgery course and hospital stay.  Patient verbalized  understanding of the above and consents to blood transfusion in the event of a life-threatening hemorrhage. Answered all questions.  - Reviewed pre-op instructions, expected  post-op course. She understands she will need to be NPO after midnight the night prior to the procedure. She understands she will have a PAT visit. She understands she will be notified by the administrative scheduler of scheduled date/time for the above. Answered all questions, she will call with any issues. - understands she will have surgery rescheduled if she test positive for COVID  2. Pre-op evaluation See above   Routine preventative health maintenance measures emphasized. Please refer to After Visit Summary for other counseling recommendations.   Return in about 2 weeks (around 12/06/2019) for post op.  Total face-to-face time with patient: 25 minutes. Over 50% of encounter was spent on counseling and coordination of care.  Feliz Beam, M.D. Attending Center for [employer] farm manager)

## 2019-11-23 NOTE — Patient Instructions (Addendum)
DUE TO COVID-19 ONLY ONE VISITOR IS ALLOWED IN WAITING ROOM (VISITOR WILL HAVE A TEMPERATURE CHECK ON ARRIVAL AND MUST WEAR A FACE MASK THE ENTIRE TIME.)  ONCE YOU ARE ADMITTED TO YOUR PRIVATE ROOM, THE SAME ONE VISITOR IS ALLOWED TO VISIT DURING VISITING HOURS ONLY.  Your COVID swab testing is scheduled for Friday, 11-30-19 at 8:10 AM  ,   You must self quarantine after your testing per handout given to you at the testing site. [address] [place], [place] [address]  (Must self quarantine after testing. Follow instructions on handout.)       Your procedure is scheduled on:  Tuesday, 12-04-19  Report to [place] AT 8:00 A. M.   Call this number if you have problems the morning of surgery:[phone].   OUR ADDRESS IS [place].  WE ARE LOCATED IN [facility].                                     REMEMBER:  DO NOT EAT FOOD AFTER MIDNIGHT.  YOU MAY HAVE CLEAR LIQUIDS FROM MIDNIGHT UNTIL 7:00 AM  CLEAR LIQUID DIET  Foods Allowed                                                                     Foods Excluded  Water, Black Coffee and tea, regular and decaf             Liquids that you cannot  Plain Jell-O in any flavor  (No red)                                    see through such as: Fruit ices (not with fruit pulp)                                      milk, soups, orange juice  Iced Popsicles (No red)                                     All solid food                                   Apple juices Sports drinks like Gatorade (No red) Lightly seasoned clear broth or consume(fat free) Sugar, honey syrup   BRUSH YOUR TEETH THE MORNING OF SURGERY.  TAKE THESE MEDICATIONS MORNING OF SURGERY WITH A SIP OF WATER:  None  DO NOT WEAR JEWERLY, MAKE UP, OR NAIL POLISH.  DO NOT WEAR LOTIONS, POWDERS, PERFUMES/COLOGNE OR DEODORANT.  DO NOT SHAVE FOR 24 HOURS PRIOR TO DAY OF SURGERY.  CONTACTS, GLASSES, OR DENTURES MAY NOT BE WORN TO  SURGERY.  [facility] IS NOT RESPONSIBLE  FOR ANY BELONGINGS.                                                                    [facility] - Preparing for Surgery Before surgery, you can play an important role.  Because skin is not sterile, your skin needs to be as free of germs as possible.  You can reduce the number of germs on your skin by washing with CHG (chlorahexidine gluconate) soap before surgery.  CHG is an antiseptic cleaner which kills germs and bonds with the skin to continue killing germs even after washing. Please DO NOT use if you have an allergy to CHG or antibacterial soaps.  If your skin becomes reddened/irritated stop using the CHG and inform your nurse when you arrive at Short Stay. Do not shave (including legs and underarms) for at least 48 hours prior to the first CHG shower.  You may shave your face/neck.  Please follow these instructions carefully:  1.  Shower with CHG Soap the night before surgery and the  morning of surgery.  2.  If you choose to wash your hair, wash your hair first as usual with your normal  shampoo.  3.  After you shampoo, rinse your hair and body thoroughly to remove the shampoo.                             4.  Use CHG as you would any other liquid soap.  You can apply chg directly to the skin and wash.  Gently with a scrungie or clean washcloth.  5.  Apply the CHG Soap to your body ONLY FROM THE NECK DOWN.   Do   not use on face/ open                           Wound or open sores. Avoid contact with eyes, ears mouth and   genitals (private parts).                       Wash face,  Genitals (private parts) with your normal soap.             6.  Wash thoroughly, paying special attention to the area where your    surgery  will be performed.  7.  Thoroughly rinse your body with warm water from the neck down.  8.  DO NOT shower/wash with your normal soap after using and rinsing off the CHG Soap.                9.  Pat  yourself dry with a clean towel.            10.  Wear clean pajamas.            11.  Place clean sheets on your bed the night of your first shower and do not  sleep with pets. Day of Surgery : Do not apply any lotions/deodorants the morning of surgery.  Please wear clean clothes to the hospital/surgery center.  FAILURE TO FOLLOW THESE INSTRUCTIONS MAY RESULT IN THE CANCELLATION OF YOUR SURGERY  PATIENT SIGNATURE_________________________________  NURSE SIGNATURE__________________________________  ________________________________________________________________________

## 2019-11-23 NOTE — Progress Notes (Addendum)
COVID Vaccine Completed:  x2 Date COVID Vaccine completed:  03-24-19 & 04-14-19 COVID vaccine manufacturer: Pfizer    Moderna   [employer]   PCP - Tania Ade, MD Cardiologist -   Chest x-ray -  EKG -  Stress Test -  ECHO -  Cardiac Cath -  Pacemaker/ICD device last checked:  Sleep Study -  CPAP -   Fasting Blood Sugar -  Checks Blood Sugar _____ times a day  Blood Thinner Instructions: Aspirin Instructions: Last Dose:  Anesthesia review:   Patient denies shortness of breath, fever, cough and chest pain at PAT appointment   Patient verbalized understanding of instructions that were given to them at the PAT appointment. Patient was also instructed that they will need to review over the PAT instructions again at home before surgery.

## 2019-11-23 NOTE — Progress Notes (Signed)
Please enter orders for PAT visit scheduled for 11-27-19

## 2019-11-26 ENCOUNTER — Encounter: Payer: Self-pay | Admitting: Obstetrics and Gynecology

## 2019-11-27 ENCOUNTER — Encounter (HOSPITAL_COMMUNITY)
Admission: RE | Admit: 2019-11-27 | Discharge: 2019-11-27 | Disposition: A | Discharge status: Home / Self Care | Payer: BC Managed Care – PPO | Source: Ambulatory Visit | Attending: Obstetrics and Gynecology | Admitting: Obstetrics and Gynecology

## 2019-11-27 ENCOUNTER — Other Ambulatory Visit: Payer: Self-pay

## 2019-11-27 ENCOUNTER — Encounter (HOSPITAL_COMMUNITY): Payer: Self-pay

## 2019-11-27 DIAGNOSIS — Z01818 Encounter for other preprocedural examination: Secondary | ICD-10-CM | POA: Diagnosis not present

## 2019-11-27 LAB — BASIC METABOLIC PANEL
Anion gap: 7 (ref 5–15)
BUN: 14 mg/dL (ref 6–20)
CO2: 24 mmol/L (ref 22–32)
Calcium: 8.9 mg/dL (ref 8.9–10.3)
Chloride: 108 mmol/L (ref 98–111)
Creatinine, Ser: 0.8 mg/dL (ref 0.44–1.00)
GFR, Estimated: >60 mL/min (ref >60)
Glucose, Bld: 77 mg/dL (ref 70–99)
Potassium: 3.7 mmol/L (ref 3.5–5.1)
Sodium: 139 mmol/L (ref 135–145)

## 2019-11-27 LAB — CBC
HCT: 39.4 % (ref 36.0–46.0)
Hemoglobin: 13.1 g/dL (ref 12.0–15.0)
MCH: 31.9 pg (ref 26.0–34.0)
MCHC: 33.2 g/dL (ref 30.0–36.0)
MCV: 95.9 fL (ref 80.0–100.0)
Platelets: 185 10*3/uL (ref 150–400)
RBC: 4.11 MIL/uL (ref 3.87–5.11)
RDW: 11.9 % (ref 11.5–15.5)
WBC: 5.6 10*3/uL (ref 4.0–10.5)
nRBC: 0 % (ref 0.0–0.2)

## 2019-11-30 ENCOUNTER — Other Ambulatory Visit (HOSPITAL_COMMUNITY)
Admission: RE | Admit: 2019-11-30 | Discharge: 2019-11-30 | Disposition: A | Discharge status: Home / Self Care | Payer: BC Managed Care – PPO | Source: Ambulatory Visit | Attending: Obstetrics and Gynecology | Admitting: Obstetrics and Gynecology

## 2019-11-30 DIAGNOSIS — Z01812 Encounter for preprocedural laboratory examination: Secondary | ICD-10-CM | POA: Diagnosis not present

## 2019-11-30 DIAGNOSIS — Z20822 Contact with and (suspected) exposure to covid-19: Secondary | ICD-10-CM | POA: Insufficient documentation

## 2019-11-30 LAB — SARS CORONAVIRUS 2 (TAT 6-24 HRS): SARS Coronavirus 2: NEGATIVE

## 2019-12-04 ENCOUNTER — Encounter (HOSPITAL_BASED_OUTPATIENT_CLINIC_OR_DEPARTMENT_OTHER)
Admission: RE | Disposition: A | Discharge status: Home / Self Care | Payer: Self-pay | Source: Home / Self Care | Attending: Obstetrics and Gynecology

## 2019-12-04 ENCOUNTER — Encounter (HOSPITAL_BASED_OUTPATIENT_CLINIC_OR_DEPARTMENT_OTHER): Payer: Self-pay | Admitting: Obstetrics and Gynecology

## 2019-12-04 ENCOUNTER — Ambulatory Visit (HOSPITAL_BASED_OUTPATIENT_CLINIC_OR_DEPARTMENT_OTHER)
Admission: RE | Admit: 2019-12-04 | Discharge: 2019-12-05 | Disposition: A | Discharge status: Home / Self Care | Payer: BC Managed Care – PPO | Attending: Obstetrics and Gynecology | Admitting: Obstetrics and Gynecology

## 2019-12-04 ENCOUNTER — Ambulatory Visit (HOSPITAL_BASED_OUTPATIENT_CLINIC_OR_DEPARTMENT_OTHER): Payer: BC Managed Care – PPO | Admitting: Certified Registered"

## 2019-12-04 DIAGNOSIS — D251 Intramural leiomyoma of uterus: Secondary | ICD-10-CM | POA: Diagnosis not present

## 2019-12-04 DIAGNOSIS — Z79899 Other long term (current) drug therapy: Secondary | ICD-10-CM | POA: Diagnosis not present

## 2019-12-04 DIAGNOSIS — N939 Abnormal uterine and vaginal bleeding, unspecified: Secondary | ICD-10-CM | POA: Insufficient documentation

## 2019-12-04 DIAGNOSIS — D259 Leiomyoma of uterus, unspecified: Secondary | ICD-10-CM | POA: Diagnosis not present

## 2019-12-04 DIAGNOSIS — N72 Inflammatory disease of cervix uteri: Secondary | ICD-10-CM | POA: Diagnosis not present

## 2019-12-04 HISTORY — PX: VAGINAL HYSTERECTOMY: SHX2639

## 2019-12-04 LAB — TYPE AND SCREEN
ABO/RH(D): A POS
Antibody Screen: NEGATIVE

## 2019-12-04 LAB — POCT PREGNANCY, URINE: Preg Test, Ur: NEGATIVE

## 2019-12-04 SURGERY — HYSTERECTOMY, VAGINAL
Anesthesia: General | Laterality: Bilateral

## 2019-12-04 MED ORDER — ALPRAZOLAM 0.5 MG PO TABS: 0.5000 mg | ORAL_TABLET | Freq: Every evening | ORAL | Status: DC | PRN

## 2019-12-04 MED ORDER — CHLORHEXIDINE GLUCONATE 0.12 % MT SOLN: 15.0000 mL | Freq: Once | OROMUCOSAL | Status: DC

## 2019-12-04 MED ORDER — IBUPROFEN 800 MG PO TABS
ORAL_TABLET | ORAL | Status: AC
  Filled 2019-12-04: qty 1

## 2019-12-04 MED ORDER — DOCUSATE SODIUM 100 MG PO CAPS
100.0000 mg | ORAL_CAPSULE | Freq: Two times a day (BID) | ORAL | Status: DC
  Administered 2019-12-04 (×2): 100 mg via ORAL

## 2019-12-04 MED ORDER — DEXAMETHASONE SODIUM PHOSPHATE 10 MG/ML IJ SOLN
INTRAMUSCULAR | Status: AC
  Filled 2019-12-04: qty 1

## 2019-12-04 MED ORDER — DEXAMETHASONE SODIUM PHOSPHATE 10 MG/ML IJ SOLN
INTRAMUSCULAR | Status: DC | PRN
  Administered 2019-12-04: 10 mg via INTRAVENOUS

## 2019-12-04 MED ORDER — ACETAMINOPHEN 500 MG PO TABS
ORAL_TABLET | ORAL | Status: AC
  Filled 2019-12-04: qty 2

## 2019-12-04 MED ORDER — ONDANSETRON HCL 4 MG PO TABS: 4.0000 mg | ORAL_TABLET | Freq: Four times a day (QID) | ORAL | Status: DC | PRN

## 2019-12-04 MED ORDER — LACTATED RINGERS IV SOLN: INTRAVENOUS | Status: DC

## 2019-12-04 MED ORDER — MIDAZOLAM HCL 2 MG/2ML IJ SOLN
INTRAMUSCULAR | Status: DC | PRN
  Administered 2019-12-04: 2 mg via INTRAVENOUS

## 2019-12-04 MED ORDER — VASOPRESSIN 20 UNIT/ML IV SOLN
INTRAVENOUS | Status: AC
  Filled 2019-12-04: qty 1

## 2019-12-04 MED ORDER — HYDROMORPHONE HCL 1 MG/ML IJ SOLN
INTRAMUSCULAR | Status: AC
  Filled 2019-12-04: qty 1

## 2019-12-04 MED ORDER — ENOXAPARIN SODIUM 40 MG/0.4ML ~~LOC~~ SOLN: 40.0000 mg | SUBCUTANEOUS | Status: DC

## 2019-12-04 MED ORDER — FENTANYL CITRATE (PF) 100 MCG/2ML IJ SOLN
INTRAMUSCULAR | Status: DC | PRN
  Administered 2019-12-04: 100 ug via INTRAVENOUS
  Administered 2019-12-04 (×5): 50 ug via INTRAVENOUS

## 2019-12-04 MED ORDER — ONDANSETRON HCL 4 MG/2ML IJ SOLN
INTRAMUSCULAR | Status: AC
  Filled 2019-12-04: qty 2

## 2019-12-04 MED ORDER — ONDANSETRON HCL 4 MG/2ML IJ SOLN: 4.0000 mg | Freq: Once | INTRAMUSCULAR | Status: DC | PRN

## 2019-12-04 MED ORDER — POVIDONE-IODINE 10 % EX SWAB: 2.0000 "application " | Freq: Once | CUTANEOUS | Status: DC

## 2019-12-04 MED ORDER — MIDAZOLAM HCL 2 MG/2ML IJ SOLN
INTRAMUSCULAR | Status: AC
  Filled 2019-12-04: qty 2

## 2019-12-04 MED ORDER — MEPERIDINE HCL 25 MG/ML IJ SOLN
6.2500 mg | INTRAMUSCULAR | Status: DC | PRN
  Administered 2019-12-04 (×2): 6.25 mg via INTRAVENOUS

## 2019-12-04 MED ORDER — SUGAMMADEX SODIUM 200 MG/2ML IV SOLN
INTRAVENOUS | Status: DC | PRN
  Administered 2019-12-04: 150 mg via INTRAVENOUS

## 2019-12-04 MED ORDER — 0.9 % SODIUM CHLORIDE (POUR BTL) OPTIME
TOPICAL | Status: DC | PRN
  Administered 2019-12-04: 1000 mL

## 2019-12-04 MED ORDER — FENTANYL CITRATE (PF) 250 MCG/5ML IJ SOLN
INTRAMUSCULAR | Status: AC
  Filled 2019-12-04: qty 5

## 2019-12-04 MED ORDER — SCOPOLAMINE 1 MG/3DAYS TD PT72
MEDICATED_PATCH | TRANSDERMAL | Status: AC
  Filled 2019-12-04: qty 1

## 2019-12-04 MED ORDER — ORAL CARE MOUTH RINSE: 15.0000 mL | Freq: Once | OROMUCOSAL | Status: DC

## 2019-12-04 MED ORDER — PROPOFOL 10 MG/ML IV BOLUS
INTRAVENOUS | Status: DC | PRN
  Administered 2019-12-04: 200 mg via INTRAVENOUS

## 2019-12-04 MED ORDER — MEPERIDINE HCL 25 MG/ML IJ SOLN
INTRAMUSCULAR | Status: AC
  Filled 2019-12-04: qty 1

## 2019-12-04 MED ORDER — KETOROLAC TROMETHAMINE 30 MG/ML IJ SOLN
INTRAMUSCULAR | Status: DC | PRN
  Administered 2019-12-04: 30 mg via INTRAVENOUS

## 2019-12-04 MED ORDER — HYDROMORPHONE HCL 1 MG/ML IJ SOLN
0.2500 mg | INTRAMUSCULAR | Status: DC | PRN
  Administered 2019-12-04 (×2): 0.5 mg via INTRAVENOUS

## 2019-12-04 MED ORDER — KETOROLAC TROMETHAMINE 30 MG/ML IJ SOLN
INTRAMUSCULAR | Status: AC
  Filled 2019-12-04: qty 1

## 2019-12-04 MED ORDER — IBUPROFEN 800 MG PO TABS
800.0000 mg | ORAL_TABLET | Freq: Three times a day (TID) | ORAL | Status: DC
  Administered 2019-12-04 – 2019-12-05 (×2): 800 mg via ORAL

## 2019-12-04 MED ORDER — IBUPROFEN 600 MG PO TABS: 600.0000 mg | ORAL_TABLET | Freq: Four times a day (QID) | ORAL | 2 refills | Status: AC | PRN

## 2019-12-04 MED ORDER — GLYCOPYRROLATE PF 0.2 MG/ML IJ SOSY
PREFILLED_SYRINGE | INTRAMUSCULAR | Status: AC
  Filled 2019-12-04: qty 1

## 2019-12-04 MED ORDER — LIDOCAINE 2% (20 MG/ML) 5 ML SYRINGE
INTRAMUSCULAR | Status: AC
  Filled 2019-12-04: qty 5

## 2019-12-04 MED ORDER — OXYCODONE HCL 5 MG PO TABS
5.0000 mg | ORAL_TABLET | ORAL | 0 refills | Status: DC | PRN
Start: 2019-12-04 — End: 2019-12-24

## 2019-12-04 MED ORDER — VASOPRESSIN 20 UNIT/ML IV SOLN
INTRAVENOUS | Status: DC | PRN
  Administered 2019-12-04: 10 mL via INTRAMUSCULAR

## 2019-12-04 MED ORDER — ONDANSETRON HCL 4 MG/2ML IJ SOLN: 4.0000 mg | Freq: Four times a day (QID) | INTRAMUSCULAR | Status: DC | PRN

## 2019-12-04 MED ORDER — ROCURONIUM BROMIDE 10 MG/ML (PF) SYRINGE
PREFILLED_SYRINGE | INTRAVENOUS | Status: DC | PRN
  Administered 2019-12-04: 20 mg via INTRAVENOUS
  Administered 2019-12-04: 50 mg via INTRAVENOUS

## 2019-12-04 MED ORDER — HYDROMORPHONE HCL 1 MG/ML IJ SOLN: 0.2000 mg | INTRAMUSCULAR | Status: DC | PRN

## 2019-12-04 MED ORDER — DOCUSATE SODIUM 100 MG PO CAPS
ORAL_CAPSULE | ORAL | Status: AC
  Filled 2019-12-04: qty 1

## 2019-12-04 MED ORDER — OXYCODONE HCL 5 MG PO TABS
ORAL_TABLET | ORAL | Status: AC
  Filled 2019-12-04: qty 1

## 2019-12-04 MED ORDER — CEFAZOLIN SODIUM-DEXTROSE 2-4 GM/100ML-% IV SOLN
2.0000 g | INTRAVENOUS | Status: AC
  Administered 2019-12-04: 2 g via INTRAVENOUS

## 2019-12-04 MED ORDER — ACETAMINOPHEN 325 MG PO TABS
ORAL_TABLET | ORAL | Status: DC | PRN
  Administered 2019-12-04: 1000 mg via ORAL

## 2019-12-04 MED ORDER — POLYETHYLENE GLYCOL 3350 17 G PO PACK: 17.0000 g | PACK | Freq: Every day | ORAL | Status: DC | PRN

## 2019-12-04 MED ORDER — SIMETHICONE 80 MG PO CHEW: 80.0000 mg | CHEWABLE_TABLET | Freq: Four times a day (QID) | ORAL | Status: DC | PRN

## 2019-12-04 MED ORDER — SODIUM CHLORIDE (PF) 0.9 % IJ SOLN
INTRAMUSCULAR | Status: AC
  Filled 2019-12-04: qty 100

## 2019-12-04 MED ORDER — SOD CITRATE-CITRIC ACID 500-334 MG/5ML PO SOLN: 30.0000 mL | ORAL | Status: DC

## 2019-12-04 MED ORDER — PROPOFOL 10 MG/ML IV BOLUS
INTRAVENOUS | Status: AC
  Filled 2019-12-04: qty 20

## 2019-12-04 MED ORDER — OXYCODONE HCL 5 MG PO TABS
5.0000 mg | ORAL_TABLET | ORAL | Status: DC | PRN
  Administered 2019-12-04 (×2): 5 mg via ORAL
  Administered 2019-12-04 – 2019-12-05 (×3): 10 mg via ORAL

## 2019-12-04 MED ORDER — LIDOCAINE 2% (20 MG/ML) 5 ML SYRINGE
INTRAMUSCULAR | Status: DC | PRN
  Administered 2019-12-04: 80 mg via INTRAVENOUS

## 2019-12-04 MED ORDER — ROCURONIUM BROMIDE 10 MG/ML (PF) SYRINGE
PREFILLED_SYRINGE | INTRAVENOUS | Status: AC
  Filled 2019-12-04: qty 10

## 2019-12-04 MED ORDER — ONDANSETRON HCL 4 MG/2ML IJ SOLN
INTRAMUSCULAR | Status: DC | PRN
  Administered 2019-12-04 (×2): 4 mg via INTRAVENOUS

## 2019-12-04 MED ORDER — ACETAMINOPHEN 10 MG/ML IV SOLN: 1000.0000 mg | Freq: Once | INTRAVENOUS | Status: DC | PRN

## 2019-12-04 MED ORDER — CEFAZOLIN SODIUM-DEXTROSE 2-4 GM/100ML-% IV SOLN
INTRAVENOUS | Status: AC
  Filled 2019-12-04: qty 100

## 2019-12-04 MED ORDER — OXYCODONE HCL 5 MG PO TABS
ORAL_TABLET | ORAL | Status: AC
  Filled 2019-12-04: qty 2

## 2019-12-04 MED ORDER — GLYCOPYRROLATE PF 0.2 MG/ML IJ SOSY
PREFILLED_SYRINGE | INTRAMUSCULAR | Status: DC | PRN
  Administered 2019-12-04: .2 mg via INTRAVENOUS

## 2019-12-04 MED ORDER — SCOPOLAMINE 1 MG/3DAYS TD PT72
MEDICATED_PATCH | TRANSDERMAL | Status: DC | PRN
  Administered 2019-12-04: 1 via TRANSDERMAL

## 2019-12-04 SURGICAL SUPPLY — 27 items
CANISTER SUCT 3000ML PPV (MISCELLANEOUS) ×3 IMPLANT
COVER WAND RF STERILE (DRAPES) ×3 IMPLANT
DECANTER SPIKE VIAL GLASS SM (MISCELLANEOUS) IMPLANT
GAUZE PACKING 2X5 YD STRL (GAUZE/BANDAGES/DRESSINGS) IMPLANT
GLOVE BIOGEL PI IND STRL 6.5 (GLOVE) ×1 IMPLANT
GLOVE BIOGEL PI IND STRL 7.0 (GLOVE) ×1 IMPLANT
GLOVE BIOGEL PI INDICATOR 6.5 (GLOVE) ×2
GLOVE BIOGEL PI INDICATOR 7.0 (GLOVE) ×2
GLOVE ECLIPSE 7.0 STRL STRAW (GLOVE) ×6 IMPLANT
GLOVE ORTHOPEDIC STR SZ6.5 (GLOVE) ×3 IMPLANT
GOWN STRL REUS W/ TWL LRG LVL3 (GOWN DISPOSABLE) ×4 IMPLANT
GOWN STRL REUS W/TWL LRG LVL3 (GOWN DISPOSABLE) ×12
KIT TURNOVER CYSTO (KITS) ×3 IMPLANT
NEEDLE HYPO 22GX1.5 SAFETY (NEEDLE) IMPLANT
NEEDLE SPNL 18GX3.5 QUINCKE PK (NEEDLE) ×3 IMPLANT
NS IRRIG 1000ML POUR BTL (IV SOLUTION) ×3 IMPLANT
PACK VAGINAL WOMENS (CUSTOM PROCEDURE TRAY) ×3 IMPLANT
PAD OB MATERNITY 4.3X12.25 (PERSONAL CARE ITEMS) ×3 IMPLANT
SUT VIC AB 0 CT1 18XCR BRD8 (SUTURE) ×3 IMPLANT
SUT VIC AB 0 CT1 27 (SUTURE) ×6
SUT VIC AB 0 CT1 27XBRD ANBCTR (SUTURE) ×2 IMPLANT
SUT VIC AB 0 CT1 8-18 (SUTURE) ×9
SUT VICRYL 0 TIES 12 18 (SUTURE) ×3 IMPLANT
SYR 20ML LL LF (SYRINGE) ×3 IMPLANT
TOWEL OR 17X26 10 PK STRL BLUE (TOWEL DISPOSABLE) ×6 IMPLANT
TRAY FOLEY W/BAG SLVR 14FR (SET/KITS/TRAYS/PACK) ×3 IMPLANT
UNDERPAD 30X36 HEAVY ABSORB (UNDERPADS AND DIAPERS) ×3 IMPLANT

## 2019-12-04 NOTE — Transfer of Care (Signed)
Immediate Anesthesia Transfer of Care Note  Patient: Glory Buff H Mastrangelo  Procedure(s) Performed: Procedure(s) (LRB): HYSTERECTOMY VAGINAL WITH SALPINGECTOMY (Bilateral)  Patient Location: PACU  Anesthesia Type: General  Level of Consciousness: awake, alert  and oriented  Airway & Oxygen Therapy: Patient Spontanous Breathing and Patient connected to nasal cannula oxygen  Post-op Assessment: Report given to PACU RN and Post -op Vital signs reviewed and stable  Post vital signs: Reviewed and stable  Complications: No apparent anesthesia complicationsLast Vitals:  Vitals Value Taken Time  BP    Temp    Pulse    Resp    SpO2      Last Pain:  Vitals:   12/04/19 0843  TempSrc: Oral  PainSc: 0-No pain      Patients Stated Pain Goal: 5 (00/92/33 0076)  Complications: No complications documented.

## 2019-12-04 NOTE — Anesthesia Preprocedure Evaluation (Signed)
Anesthesia Evaluation  Patient identified by MRN, date of birth, ID band Patient awake    Reviewed: Patient's Chart, lab work & pertinent test results  Airway Mallampati: II  TM Distance: >3 FB Neck ROM: Full    Dental  (+) Teeth Intact   Pulmonary neg pulmonary ROS,    Pulmonary exam normal        Cardiovascular negative cardio ROS   Rhythm:Regular Rate:Normal     Neuro/Psych negative neurological ROS  negative psych ROS   GI/Hepatic negative GI ROS, Neg liver ROS,   Endo/Other  negative endocrine ROS  Renal/GU negative Renal ROS  Female GU complaint Uterine bleeding    Musculoskeletal negative musculoskeletal ROS (+)   Abdominal (+)  Abdomen: soft. Bowel sounds: normal.  Peds  Hematology negative hematology ROS (+)   Anesthesia Other Findings   Reproductive/Obstetrics History molar pregnancy                             Anesthesia Physical Anesthesia Plan  ASA: II  Anesthesia Plan: General   Post-op Pain Management:    Induction: Intravenous  PONV Risk Score and Plan: 3 and Ondansetron, Dexamethasone, Midazolam and Treatment may vary due to age or medical condition  Airway Management Planned: Mask and Oral ETT  Additional Equipment: None  Intra-op Plan:   Post-operative Plan: Extubation in OR  Informed Consent: I have reviewed the patients History and Physical, chart, labs and discussed the procedure including the risks, benefits and alternatives for the proposed anesthesia with the patient or authorized representative who has indicated his/her understanding and acceptance.     Dental advisory given  Plan Discussed with: CRNA  Anesthesia Plan Comments: (Lab Results      Component                Value               Date                      WBC                      5.6                 11/27/2019                HGB                      13.1                 11/27/2019                HCT                      39.4                11/27/2019                MCV                      95.9                11/27/2019                PLT                      185  11/27/2019           Lab Results      Component                Value               Date                      PREGTESTUR               NEGATIVE            12/04/2019                HCGQUANT                 <1                  09/27/2019          )        Anesthesia Quick Evaluation

## 2019-12-04 NOTE — Discharge Instructions (Signed)
Vaginal Hysterectomy, Care After Refer to this sheet in the next few weeks. These instructions provide you with information about caring for yourself after your procedure. Your health care provider may also give you more specific instructions. Your treatment has been planned according to current medical practices, but problems sometimes occur. Call your health care provider if you have any problems or questions after your procedure. What can I expect after the procedure? After the procedure, it is common to have:  Pain.  Soreness and numbness in your incision areas.  Vaginal bleeding and discharge.  Constipation.  Temporary problems emptying the bladder.  Feelings of sadness or other emotions. Follow these instructions at home: Medicines  Take over-the-counter and prescription medicines only as told by your health care provider.  If you were prescribed an antibiotic medicine, take it as told by your health care provider. Do not stop taking the antibiotic even if you start to feel better.  Do not drive or operate heavy machinery while taking prescription pain medicine. Activity  Return to your normal activities as told by your health care provider. Ask your health care provider what activities are safe for you.  Get regular exercise as told by your health care provider. You may be told to take short walks every day and go farther each time.  Do not lift anything that is heavier than 10 lb (4.5 kg). General instructions   Do not put anything in your vagina for 6 weeks after your surgery or as told by your health care provider. This includes tampons and douches.  Do not have sex until your health care provider says you can.  Do not take baths, swim, or use a hot tub until your health care provider approves.  Drink enough fluid to keep your urine clear or pale yellow.  Do not drive for 24 hours if you were given a sedative.  Keep all follow-up visits as told by your health  care provider. This is important. Contact a health care provider if:  Your pain medicine is not helping.  You have a fever.  You have redness, swelling, or pain at your incision site.  You have blood, pus, or a bad-smelling discharge from your vagina.  You continue to have difficulty urinating. Get help right away if:  You have severe abdominal or back pain.  You have heavy bleeding from your vagina.  You have chest pain or shortness of breath. This information is not intended to replace advice given to you by your health care provider. Make sure you discuss any questions you have with your health care provider. Document Revised: 08/28/2015 Document Reviewed: 01/19/2015 Elsevier Patient Education  2020 [employer]  

## 2019-12-04 NOTE — Op Note (Signed)
Aniylah Avans H Vincent PROCEDURE DATE: 12/04/2019  PREOPERATIVE DIAGNOSES: abnormal uterine bleeding  POSTOPERATIVE DIAGNOSES: The same  PROCEDURE: Vaginal hysterectomy, bilateral salpingectomy  SURGEON:  Feliz Beam, MD  ASSISTANT:  Hebert Soho, MD  ANESTHESIOLOGY TEAM: Anesthesiologist: Stoltzfus, March Rummage, DO CRNA: Mechele Claude, CRNA  INDICATIONS: Danielle Vincent is a 39 y.o. [phone] here for vaginal hysterectomy secondary to the indications listed under preoperative diagnoses; please see preoperative note for further details.  The risks of vaginal hysterectomy were discussed with the patient including but were not limited to: bleeding which may require transfusion or reoperation; infection which may require antibiotics; injury to bowel, bladder, ureters or other surrounding organs; need for additional procedures including laparotomy, incisional problems, thromboembolic phenomenon and other postoperative/anesthesia complications. She verbalizes understanding that removal of her uterus means she will not be able to get pregnant/carry a pregnancy. She consents to blood transfusion in the event of an emergency. The patient verbalized understanding of the plan, giving informed written consent for the procedure.    An experienced assistant was required given the standard of surgical care given the complexity of the case.  This assistant was needed for exposure, dissection, suctioning, retraction, instrument exchange, and for overall help during the procedure.  FINDINGS:  multiparous cervix with 8 weeks sized uterus with moderate descent, normal appearing fallopian tubes and ovaries bilaterally  UPT: negative ANESTHESIA: General INTRAVENOUS FLUIDS: 800 ml   ESTIMATED BLOOD LOSS: 150 ml URINE OUTPUT:  800 ml SPECIMENS: Specimen sent to pathology COMPLICATIONS: None immediate  PROCEDURE IN DETAIL:  The patient preoperatively received intravenous antibiotics and had  sequential compression devices applied to her lower extremities.  She was then taken to the operating room where she was placed under general anesthesia without difficulty. She was then placed in dorsal lithotomy position. She was then prepped and draped in sterile fashion.  After an adequate timeout was performed, attention was turned to her pelvis.  A weighted speculum was then placed in the vagina, and the anterior and posterior lips of the cervix were grasped bilaterally with tenaculums.  The cervix was then injected circumferentially with vasopressin solution to maintain hemostasis.  The cervix was then circumferentially incised, and the bladder was dissected off the pubocervical fascia anteriorly without complication.   The posterior cul-de-sac was entered sharply without difficulty.  A long weighted speculum was inserted into the posterior cul-de-sac.  The Heaney clamp was then used to clamp the uterosacral ligaments on either side.  They were then cut and sutured ligated with 0 Vicryl, and the ligated uterosacral ligaments were transfixed to the ipsilateral vaginal epithelium to further support the vagina and provide hemostasis. Of note, all sutures used in this case were 0 Vicryl unless otherwise noted.   The cardinal ligaments were then clamped, cut and ligated. The anterior cul-de-sac was then entered sharply without difficulty and a retractor was placed.  The uterine vessels and broad ligaments were then serially clamped with the Heaney clamps, cut, and suture ligated on both sides.  Excellent hemostasis was noted at this point.  The uterus was then delivered via the posterior cul-de-sac, and the cornua were clamped with the Heaney clamps, transected, and the uterus was delivered and sent to pathology. These pedicles were then suture ligated to ensure hemostasis.  After completion of the hysterectomy, all pedicles from the uterosacral ligament to the cornua were examined hemostasis was confirmed.  The  left fallopian tube was visualized and grasped with a Kary Kos, it was  then clamped, cut and ligated from the adnexa using 0-Vicryl. Hemostasis noted at the pedicle. The same procedure was carried out on the right side. Hemostasis noted at the pedicle.    The vaginal cuff was then closed with a in a running locked fashion with care given to incorporate the uterosacral pedicles bilaterally.  All instruments were then removed from the pelvis and a foley catheter was placed in the bladder under sterile technique and attached to gravity drainage and then removed.  The patient tolerated the procedure well.  All instruments, needles, and sponge counts were correct x 2. The patient was taken to the recovery room in stable condition.     Feliz Beam, M.D. Attending [place], [facility] for [employer], [place]

## 2019-12-04 NOTE — Brief Op Note (Signed)
12/04/2019  12:15 PM  PATIENT:  Danielle Vincent  39 y.o. female  PRE-OPERATIVE DIAGNOSIS:  ABNORMAL UTERINE BLEEDING  POST-OPERATIVE DIAGNOSIS:  ABNORMAL UTERINE BLEEDING  PROCEDURE:  Procedure(s): HYSTERECTOMY VAGINAL WITH SALPINGECTOMY (Bilateral)  SURGEON:  Surgeon(s) and Role:    * Sloan Leiter, MD - Primary    * Griffin Basil, MD - Assisting  PHYSICIAN ASSISTANT:   ASSISTANTS: none   ANESTHESIA:   general  EBL:  150 mL   BLOOD ADMINISTERED:none  DRAINS: Urinary Catheter (Foley)   LOCAL MEDICATIONS USED:  NONE  SPECIMEN:  Source of Specimen:  uterus, cervix bilateral fallopian tubes  DISPOSITION OF SPECIMEN:  PATHOLOGY  COUNTS:  YES  TOURNIQUET:  * No tourniquets in log *  DICTATION: .Note written in EPIC  PLAN OF CARE: Admit for overnight observation  PATIENT DISPOSITION:  PACU - hemodynamically stable.   Delay start of Pharmacological VTE agent (>24hrs) due to surgical blood loss or risk of bleeding: no

## 2019-12-04 NOTE — Anesthesia Procedure Notes (Signed)
Procedure Name: Intubation Date/Time: 12/04/2019 10:40 AM Performed by: Mechele Claude, CRNA Pre-anesthesia Checklist: Patient identified, Emergency Drugs available, Suction available and Patient being monitored Patient Re-evaluated:Patient Re-evaluated prior to induction Oxygen Delivery Method: Circle system utilized Preoxygenation: Pre-oxygenation with 100% oxygen Induction Type: IV induction Ventilation: Mask ventilation without difficulty Laryngoscope Size: Mac and 3 Grade View: Grade I Tube type: Oral Tube size: 7.0 mm Number of attempts: 1 Airway Equipment and Method: Stylet and Oral airway Placement Confirmation: ETT inserted through vocal cords under direct vision,  positive ETCO2 and breath sounds checked- equal and bilateral Secured at: 21 (at teeth) cm Tube secured with: Tape Dental Injury: Teeth and Oropharynx as per pre-operative assessment

## 2019-12-04 NOTE — H&P (Signed)
OB/GYN Pre-Op History and Physical  Danielle Vincent is a 39 y.o. [phone] presenting for definitive management for abnormal cervical/uterine bleeding. She has had longstanding abnormal bleeding and desires definitive management.   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 Molar pregnancy 2012  . Positive PPD, treated   . Supervision of other normal pregnancy 10/04/2011    Genetic Screen Neg [place], XX Anatomic [place] Normal female Glucose Screen  GBS  Feeding Preference Breast Contraception  Circumcision n/a    . Thrombocytopenia complicating pregnancy [facility])     Past Surgical History:  Procedure Laterality Date  . DILATION AND CURETTAGE OF UTERUS    . KNEE SURGERY  1995  . VAGINAL DELIVERY     x3  . WISDOM TOOTH EXTRACTION      OB History  Gravida Para Term Preterm AB Living  4 3 3   1 3   SAB TAB Ectopic Multiple Live Births  0       3    # Outcome Date GA Lbr Len/2nd Weight Sex Delivery Anes PTL Lv  4 Term 10/14/13 [redacted]w[redacted]d 00:39 / 00:12 3685 g F Vag-Spont EPI  LIV  3 Term 12/31/11 [redacted]w[redacted]d 01:06 / 00:12 3300 g F Vag-Spont EPI  LIV     Birth Comments: na  2 Molar 06/22/10          1 Term 02/11/09 [redacted]w[redacted]d  3544 g F Vag-Spont EPI  LIV    Social History   Socioeconomic History  . Marital status: Married    Spouse name: Not on file  . Number of children: Not on file  . Years of education: Not on file  . Highest education level: Not on file  Occupational History  . Occupation: Medical illustrator  Tobacco Use  . Smoking status: Never Smoker  . Smokeless tobacco: Never Used  Vaping Use  . Vaping Use: Never used  Substance and Sexual Activity  . Alcohol use: Yes    Comment: Social  . Drug use: No  . Sexual activity: Yes    Birth control/protection: None  Other Topics Concern  . Not on file  Social History Narrative  . Not on file   Social Determinants of Health   Financial Resource Strain:   . Difficulty of Paying Living  Expenses: Not on file  Food Insecurity:   . Worried About Charity fundraiser in the Last Year: Not on file  . Ran Out of Food in the Last Year: Not on file  Transportation Needs:   . Lack of Transportation (Medical): Not on file  . Lack of Transportation (Non-Medical): Not on file  Physical Activity:   . Days of Exercise per Week: Not on file  . Minutes of Exercise per Session: Not on file  Stress:   . Feeling of Stress : Not on file  Social Connections:   . Frequency of Communication with Friends and Family: Not on file  . Frequency of Social Gatherings with Friends and Family: Not on file  . Attends Religious Services: Not on file  . Active Member of Clubs or Organizations: Not on file  . Attends Archivist Meetings: Not on file  . Marital Status: Not on file    Family History  Problem Relation Age of Onset  . Cancer Maternal Grandmother        lung  . Diabetes Maternal Grandmother   . Hyperlipidemia Maternal Grandmother   .  Osteoporosis Maternal Grandmother   . Diabetes Paternal Grandmother   . Diabetes Paternal Grandfather   . Hyperlipidemia Mother     Medications Prior to Admission  Medication Sig Dispense Refill Last Dose  . ALPRAZolam (XANAX) 0.5 MG tablet Take 1 tablet (0.5 mg total) by mouth at bedtime as needed for anxiety. (Patient not taking: Reported on 10/18/2019) 2 tablet 0 More than a month at Unknown time  . ibuprofen (ADVIL,MOTRIN) 600 MG tablet Take 1 tablet (600 mg total) by mouth every 6 (six) hours. (Patient not taking: Reported on 10/18/2019) 30 tablet 0 Unknown at Unknown time   No Known Allergies  Review of Systems: Negative except for what is mentioned in HPI.     Physical Exam: BP 125/88   Pulse 70   Temp 98.1 F (36.7 C) (Oral)   Resp 16   Ht 5\' 11"  (1.803 m)   Wt 77.1 kg   LMP 11/26/2019   SpO2 100%   BMI 23.71 kg/m  CONSTITUTIONAL: Well-developed, well-nourished female in no acute distress.  HENT:  Normocephalic,  atraumatic, External right and left ear normal. Oropharynx is clear and moist EYES: Conjunctivae and EOM are normal. Pupils are equal, round, and reactive to light. No scleral icterus.  NECK: Normal range of motion, supple, no masses SKIN: Skin is warm and dry. No rash noted. Not diaphoretic. No erythema. No pallor. [place]: Alert and oriented to person, place, and time. Normal reflexes, muscle tone coordination. No cranial nerve deficit noted. PSYCHIATRIC: Normal mood and affect. Normal behavior. Normal judgment and thought content. CARDIOVASCULAR: Normal heart rate noted RESPIRATORY: Effort normal, no problems with respiration noted ABDOMEN: Soft, nontender, nondistended PELVIC: Deferred MUSCULOSKELETAL: Normal range of motion. No edema and no tenderness. 2+ distal pulses.   Pertinent Labs/Studies:   Results for orders placed or performed during the hospital encounter of 12/04/19 (from the past 72 hour(s))  Pregnancy, urine POC     Status: None   Collection Time: 12/04/19  8:28 AM  Result Value Ref Range   Preg Test, Ur NEGATIVE NEGATIVE    Comment:        THE SENSITIVITY OF THIS METHODOLOGY IS >24 mIU/mL    CLINICAL DATA:  Irregular vaginal bleeding, LMP 08/05/2019  EXAM: TRANSABDOMINAL AND TRANSVAGINAL ULTRASOUND OF PELVIS  TECHNIQUE: Both transabdominal and transvaginal ultrasound examinations of the pelvis were performed. Transabdominal technique was performed for global imaging of the pelvis including uterus, ovaries, adnexal regions, and pelvic cul-de-sac. It was necessary to proceed with endovaginal exam following the transabdominal exam to visualize the lower uterine segment and cervix.  COMPARISON:  03/20/2018  FINDINGS: Uterus  Measurements: 8.4 x 4.8 x 6.5 cm = volume: 138 mL. Anteverted. Nabothian cysts at cervix. Heterogeneous myometrial echogenicity without focal mass.  Endometrium  Thickness: 10 mm.  No endometrial fluid or focal  abnormality.  Right ovary  Measurements: 2.9 x 2.4 x 2.0 cm = volume: 7.5 mL. Normal morphology without mass  Left ovary  Measurements: 1.9 x 2.1 x 1.4 cm = 2.9 mL. Seen only on transabdominal imaging. Normal morphology without mass.  Other findings  No free pelvic fluid or adnexal masses.  IMPRESSION: No pelvic sonographic abnormalities identified.   Electronically Signed   By: Lavonia Dana M.D.   On: 08/15/2019 14:06     Assessment and Plan :Danielle Vincent is a 39 y.o. [phone] here for vaginal hysterectomy, bilateral salpingectomy for abnormal uterine bleeding desiring definitive management.  Reviewed recommendation for vaginal hysterectomy, given 3 x  SVD. The risks of vaginal hysterectomy were discussed with the patient; including but not limited to: infection which may require antibiotics; bleeding which may require transfusion or re-operation; injury to bowel, bladder, ureters or other surrounding organs; need for additional procedures, conversion to laparotomy, incisional problems, thromboembolic phenomenon and other postoperative/anesthesia complications. Reviewed that with a hysterectomy, she will not be able to have children in the future. Reviewed recommendation to remove fallopian tubes to reduce risk of ovarian cancer, but as she is 61, would not recommend removal of ovaries at this time. Reviewed that ovaries may be removed if they are abnormal appearing, anatomy requires it or if there is damage to blood supply. Reviewed that removal of ovaries would put her into surgical menopause and we will attempt to avoid this if possible. Reviewed expected post surgery course and hospital stay.  Patient verbalized understanding of the above and consents to blood transfusion in the event of a life-threatening hemorrhage. Answered all questions.    Patient is NPO Anesthesia aware Preoperative prophylactic antibiotics ordered SCDs  Admission labs To OR when  ready   K. Arvilla Meres, M.D. Attending [place], [facility] for [employer], [place]

## 2019-12-04 NOTE — Anesthesia Postprocedure Evaluation (Signed)
Anesthesia Post Note  Patient: Danielle Vincent  Procedure(s) Performed: HYSTERECTOMY VAGINAL WITH SALPINGECTOMY (Bilateral )     Patient location during evaluation: PACU Anesthesia Type: General Level of consciousness: awake and alert Pain management: pain level controlled Vital Signs Assessment: post-procedure vital signs reviewed and stable Respiratory status: spontaneous breathing, nonlabored ventilation, respiratory function stable and patient connected to nasal cannula oxygen Cardiovascular status: blood pressure returned to baseline and stable Postop Assessment: no apparent nausea or vomiting Anesthetic complications: no   No complications documented.  Last Vitals:  Vitals:   12/04/19 1330 12/04/19 1345  BP: 118/69 114/69  Pulse: (!) 58 88  Resp: 10 18  Temp:    SpO2: 100% 100%    Last Pain:  Vitals:   12/04/19 1326  TempSrc: Oral  PainSc:                  March Rummage [place]

## 2019-12-05 ENCOUNTER — Encounter (HOSPITAL_BASED_OUTPATIENT_CLINIC_OR_DEPARTMENT_OTHER): Payer: Self-pay | Admitting: Obstetrics and Gynecology

## 2019-12-05 DIAGNOSIS — D251 Intramural leiomyoma of uterus: Secondary | ICD-10-CM | POA: Diagnosis not present

## 2019-12-05 DIAGNOSIS — N939 Abnormal uterine and vaginal bleeding, unspecified: Secondary | ICD-10-CM | POA: Diagnosis not present

## 2019-12-05 DIAGNOSIS — N72 Inflammatory disease of cervix uteri: Secondary | ICD-10-CM | POA: Diagnosis not present

## 2019-12-05 DIAGNOSIS — Z79899 Other long term (current) drug therapy: Secondary | ICD-10-CM | POA: Diagnosis not present

## 2019-12-05 LAB — CBC
HCT: 31.9 % — ABNORMAL LOW (ref 36.0–46.0)
Hemoglobin: 10.4 g/dL — ABNORMAL LOW (ref 12.0–15.0)
MCH: 31.8 pg (ref 26.0–34.0)
MCHC: 32.6 g/dL (ref 30.0–36.0)
MCV: 97.6 fL (ref 80.0–100.0)
Platelets: 174 10*3/uL (ref 150–400)
RBC: 3.27 MIL/uL — ABNORMAL LOW (ref 3.87–5.11)
RDW: 12.4 % (ref 11.5–15.5)
WBC: 10 10*3/uL (ref 4.0–10.5)
nRBC: 0 % (ref 0.0–0.2)

## 2019-12-05 LAB — SURGICAL PATHOLOGY

## 2019-12-05 MED ORDER — IBUPROFEN 800 MG PO TABS
ORAL_TABLET | ORAL | Status: AC
  Filled 2019-12-05: qty 1

## 2019-12-05 MED ORDER — OXYCODONE HCL 5 MG PO TABS
ORAL_TABLET | ORAL | Status: AC
  Filled 2019-12-05: qty 2

## 2019-12-05 NOTE — Discharge Summary (Signed)
Physician Discharge Summary  Patient ID: Danielle Vincent MRN: [id number] DOB/AGE: 1980/02/18 39 y.o.  Admit date: 12/04/2019 Discharge date: 12/05/2019  Admission Diagnoses: abnormal uterine bleeding  Discharge Diagnoses:  Active Problems:   Abnormal uterine bleeding (AUB)   Discharged Condition: good  Hospital Course: Please see HPI dated 12/04/2019 for full details. Briefly, this is a 39 y.o. [id number] female admitted for overnight observation after total vaginal hysterectomy, bilateral salpingectomy. Her hospital course was uncomplicated. By POD#1 she was eating without nausea/vomiting, voiding spontaneously without issue, not passing flatus. Her pain was well controlled on PO pain meds and she was ambulating without difficulty.   She was deemed stable for discharge, instructions for follow up given. She will follow up in office in 2 weeks.  Medical history significant for n/a.  Physical Exam:  BP 101/63 (BP Location: Right Arm)   Pulse 65   Temp 98.7 F (37.1 C)   Resp 16   Ht 5\' 11"  (1.803 m)   Wt 77.1 kg   LMP 11/26/2019   SpO2 98%   BMI 23.71 kg/m  General: alert, oriented, cooperative Chest: normal respiratory effort Heart: RRR  Abdomen: soft, appropriately tender to palpation  DVT Evaluation: no evidence of DVT Extremities: no edema, no calf tenderness   Labs: Lab Results  Component Value Date   WBC 10.0 12/05/2019   HGB 10.4 (L) 12/05/2019   HCT 31.9 (L) 12/05/2019   MCV 97.6 12/05/2019   PLT 174 12/05/2019   CMP Latest Ref Rng & Units 11/27/2019  Glucose 70 - 99 mg/dL 77  BUN 6 - 20 mg/dL 14  Creatinine 0.44 - 1.00 mg/dL 0.80  Sodium 135 - 145 mmol/L 139  Potassium 3.5 - 5.1 mmol/L 3.7  Chloride 98 - 111 mmol/L 108  CO2 22 - 32 mmol/L 24  Calcium 8.9 - 10.3 mg/dL 8.9  Total Protein 6.0 - 8.3 g/dL -  Total Bilirubin 0.3 - 1.2 mg/dL -  Alkaline Phos 39 - 117 U/L -  AST 0 - 37 U/L -  ALT 0 - 35 U/L -   Disposition: Discharge  disposition: 01-Home or Self Care      Discharge Instructions     Call MD for:  difficulty breathing, headache or visual disturbances   Complete by: As directed    Call MD for:  difficulty breathing, headache or visual disturbances   Complete by: As directed    Call MD for:  extreme fatigue   Complete by: As directed    Call MD for:  extreme fatigue   Complete by: As directed    Call MD for:  hives   Complete by: As directed    Call MD for:  hives   Complete by: As directed    Call MD for:  persistant dizziness or light-headedness   Complete by: As directed    Call MD for:  persistant dizziness or light-headedness   Complete by: As directed    Call MD for:  persistant nausea and vomiting   Complete by: As directed    Call MD for:  persistant nausea and vomiting   Complete by: As directed    Call MD for:  redness, tenderness, or signs of infection (pain, swelling, redness, odor or green/yellow discharge around incision site)   Complete by: As directed    Call MD for:  redness, tenderness, or signs of infection (pain, swelling, redness, odor or green/yellow discharge around incision site)   Complete by: As directed  Call MD for:  severe uncontrolled pain   Complete by: As directed    Call MD for:  severe uncontrolled pain   Complete by: As directed    Call MD for:  temperature >100.4   Complete by: As directed    Call MD for:  temperature >100.4   Complete by: As directed    Diet - low sodium heart healthy   Complete by: As directed    Diet - low sodium heart healthy   Complete by: As directed    Discharge wound care:   Complete by: As directed    Wash with warm, soapy water. Pat dry, do not scrub.   Discharge wound care:   Complete by: As directed    Wash with warm, soapy water. Pat dry, do not scrub.   Increase activity slowly   Complete by: As directed    Increase activity slowly   Complete by: As directed    May shower / Bathe   Complete by: As directed     May shower / Bathe   Complete by: As directed       An After Visit Summary was printed and given to the patient. Allergies as of 12/05/2019   No Known Allergies      Medication List     TAKE these medications    ALPRAZolam 0.5 MG tablet Commonly known as: Xanax Take 1 tablet (0.5 mg total) by mouth at bedtime as needed for anxiety.   ibuprofen 600 MG tablet Commonly known as: ADVIL Take 1 tablet (600 mg total) by mouth every 6 (six) hours. What changed: Another medication with the same name was added. Make sure you understand how and when to take each. Notes to patient: Next dose is due at noon   ibuprofen 600 MG tablet Commonly known as: ADVIL Take 1 tablet (600 mg total) by mouth every 6 (six) hours as needed for headache, mild pain, moderate pain or cramping. What changed: You were already taking a medication with the same name, and this prescription was added. Make sure you understand how and when to take each.   oxyCODONE 5 MG immediate release tablet Commonly known as: Roxicodone Take 1 tablet (5 mg total) by mouth every 4 (four) hours as needed for severe pain. Notes to patient: Next dose is due at noon               Discharge Care Instructions  (From admission, onward)           Start     Ordered   12/05/19 0000  Discharge wound care:       Comments: Wash with warm, soapy water. Pat dry, do not scrub.   12/05/19 0846   12/04/19 0000  Discharge wound care:       Comments: Wash with warm, soapy water. Pat dry, do not scrub.   12/04/19 1351            Follow-up [facility] [place] [facility] [place] In 2 weeks.   Specialty: Obstetrics and Gynecology Contact information: [place], [place] [phone]                Signed: Sloan Leiter 12/05/2019, 8:46 AM

## 2019-12-24 ENCOUNTER — Ambulatory Visit (INDEPENDENT_AMBULATORY_CARE_PROVIDER_SITE_OTHER): Payer: BC Managed Care – PPO | Admitting: Obstetrics and Gynecology

## 2019-12-24 ENCOUNTER — Encounter: Payer: Self-pay | Admitting: Obstetrics and Gynecology

## 2019-12-24 ENCOUNTER — Other Ambulatory Visit: Payer: Self-pay

## 2019-12-24 VITALS — BP 127/80 | HR 72 | Resp 16 | Ht 71.0 in | Wt 166.0 lb

## 2019-12-24 DIAGNOSIS — Z9889 Other specified postprocedural states: Secondary | ICD-10-CM

## 2019-12-24 DIAGNOSIS — N939 Abnormal uterine and vaginal bleeding, unspecified: Secondary | ICD-10-CM

## 2019-12-24 DIAGNOSIS — Z90710 Acquired absence of both cervix and uterus: Secondary | ICD-10-CM

## 2019-12-24 NOTE — Progress Notes (Signed)
GYNECOLOGY OFFICE FOLLOW UP NOTE  History:  39 y.o. [id number] here today for follow up for vaginal hysterectomy, bilateral salpingectomy done on 12/04/19 for abnormal uterine bleeding. She is doing well. Minimal pain after procedure that has now resolved. No bleeding. Appetite back to normal, no issues with bathroom use. Overall, feeling well.    Past Medical History:  Diagnosis Date  . Molar pregnancy 2012  . Positive PPD, treated   . Supervision of other normal pregnancy 10/04/2011    Genetic Screen Neg [place], XX Anatomic [place] Normal female Glucose Screen  GBS  Feeding Preference Breast Contraception  Circumcision n/a    . Thrombocytopenia complicating pregnancy [facility])     Past Surgical History:  Procedure Laterality Date  . DILATION AND CURETTAGE OF UTERUS    . KNEE SURGERY  1995  . VAGINAL DELIVERY     x3  . VAGINAL HYSTERECTOMY Bilateral 12/04/2019   Procedure: HYSTERECTOMY VAGINAL WITH SALPINGECTOMY;  Surgeon: Sloan Leiter, MD;  Location: [facility];  Service: Gynecology;  Laterality: Bilateral;  . WISDOM TOOTH EXTRACTION       Current Outpatient Medications:  .  ibuprofen (ADVIL) 600 MG tablet, Take 1 tablet (600 mg total) by mouth every 6 (six) hours as needed for headache, mild pain, moderate pain or cramping., Disp: 30 tablet, Rfl: 2 .  ALPRAZolam (XANAX) 0.5 MG tablet, Take 1 tablet (0.5 mg total) by mouth at bedtime as needed for anxiety. (Patient not taking: Reported on 10/18/2019), Disp: 2 tablet, Rfl: 0  The following portions of the patient's history were reviewed and updated as appropriate: allergies, current medications, past family history, past medical history, past social history, past surgical history and problem list.   Review of Systems:  Pertinent items noted in HPI and remainder of comprehensive ROS otherwise negative.   Objective:  Physical Exam BP 127/80   Pulse 72   Resp 16   Ht 5\' 11"  (1.803 m)   Wt 166 lb (75.3 kg)   LMP  11/26/2019   BMI 23.15 kg/m  CONSTITUTIONAL: Well-developed, well-nourished female in no acute distress.  HENT:  Normocephalic, atraumatic. External right and left ear normal. Oropharynx is clear and moist EYES: Conjunctivae and EOM are normal. Pupils are equal, round, and reactive to light. No scleral icterus.  NECK: Normal range of motion, supple, no masses SKIN: Skin is warm and dry. No rash noted. Not diaphoretic. No erythema. No pallor. NEUROLOGIC: Alert and oriented to person, place, and time. Normal reflexes, muscle tone coordination. No cranial nerve deficit noted. PSYCHIATRIC: Normal mood and affect. Normal behavior. Normal judgment and thought content. CARDIOVASCULAR: Normal heart rate noted RESPIRATORY: Effort normal, no problems with respiration noted ABDOMEN: Soft, no distention noted.  PELVIC: Normal appearing external genitalia; 1 mm blood blister on left mons, no tenderness, normal appearing vaginal mucosa, cuff intact, sutures visible, no bleeding noted.  No abnormal discharge noted.  Cuff palpates intact with minimal tenderness MUSCULOSKELETAL: Normal range of motion. No edema noted.  Exam done with chaperone present.  Labs and Imaging No results found.  FINAL MICROSCOPIC DIAGNOSIS:   A. UTERUS, CERVIX AND BILATERAL FALLOPIAN TUBES, HYSTERECTOMY AND  BILATERAL SALPINGECTOMY:  Cervix:  - Mild acute and chronic cervicitis.  Uterus:  - Endometrium: Proliferative endometrium.  - Myometrium: Leiomyoma.  - Serosa: No significant histopathologic findings.  Adnexa:  - Fallopian tubes: No significant histopathologic findings.   Assessment & Plan:   1. Postoperative state Doing very well Nothing in vagina until next visit  No issues  2. S/P hysterectomy  3. Abnormal uterine bleeding (AUB)   Routine preventative health maintenance measures emphasized. Please refer to After Visit Summary for other counseling recommendations.   Return in about 4 weeks (around  01/21/2020) for in person, Followup.  Total face-to-face time with patient: 15 minutes. Over 50% of encounter was spent on counseling and coordination of care.  Feliz Beam, M.D. Attending Center for [employer] [facility])

## 2020-01-17 ENCOUNTER — Other Ambulatory Visit: Payer: Self-pay

## 2020-01-17 ENCOUNTER — Ambulatory Visit (INDEPENDENT_AMBULATORY_CARE_PROVIDER_SITE_OTHER): Payer: BC Managed Care – PPO | Admitting: Obstetrics & Gynecology

## 2020-01-17 ENCOUNTER — Encounter: Payer: Self-pay | Admitting: Obstetrics & Gynecology

## 2020-01-17 VITALS — BP 120/79 | HR 85 | Resp 16 | Ht 70.0 in | Wt 166.0 lb

## 2020-01-17 DIAGNOSIS — Z90710 Acquired absence of both cervix and uterus: Secondary | ICD-10-CM | POA: Insufficient documentation

## 2020-01-17 DIAGNOSIS — Z9889 Other specified postprocedural states: Secondary | ICD-10-CM

## 2020-01-17 NOTE — Progress Notes (Signed)
   Subjective:    Patient ID: Danielle Vincent, female    DOB: 1980-08-20, 39 y.o.   MRN: [id number]  HPI  Patient presents 6 weeks and 2 days after her transvaginal hysterectomy.  Patient has done very well since the procedure.  She has been walking.  No vigorous exercise nor intercourse.  Patient has not had any bleeding nor vaginal discharge.  Review of Systems  Constitutional: Negative.   Respiratory: Negative.   Cardiovascular: Negative.   Gastrointestinal: Negative.   Genitourinary: Negative.        Objective:   Physical Exam Vitals reviewed.  Constitutional:      General: She is not in acute distress.    Appearance: She is well-developed and well-nourished.  HENT:     Head: Normocephalic and atraumatic.  Eyes:     Conjunctiva/sclera: Conjunctivae normal.  Cardiovascular:     Rate and Rhythm: Normal rate.  Pulmonary:     Effort: Pulmonary effort is normal.  Abdominal:     General: Abdomen is flat. There is no distension.     Palpations: Abdomen is soft.     Tenderness: There is no abdominal tenderness. There is no guarding.  Genitourinary:    Comments: Tanner V Vulva:  No lesion Vagina:  Pink, no lesions, no discharge, no blood, cuff well healed   Musculoskeletal:        General: No edema.  Skin:    General: Skin is warm and dry.  Neurological:     Mental Status: She is alert and oriented to person, place, and time.  Psychiatric:        Mood and Affect: Mood and affect and mood normal.        Behavior: Behavior normal.    Vitals:   01/17/20 0843  BP: 120/79  Pulse: 85  Resp: 16  Weight: 166 lb (75.3 kg)  Height: 5\' 10"  (1.778 m)      Assessment & Plan:  39 year old female with normal exam status post transvaginal hysterectomy. She can resume normal activity including intercourse. Mammograms yearly Return to clinic in 1 year.

## 2020-01-24 ENCOUNTER — Ambulatory Visit: Payer: Self-pay | Admitting: Obstetrics and Gynecology

## 2020-12-25 IMAGING — MG DIGITAL SCREENING BILAT W/ TOMO W/ CAD
8 series · 9 of 24 positions shown · non-contrast
Comparison: Previous exam(s).

CLINICAL DATA: Screening.

EXAM:
DIGITAL SCREENING BILATERAL MAMMOGRAM WITH TOMO AND CAD

[L CC synth-2D]
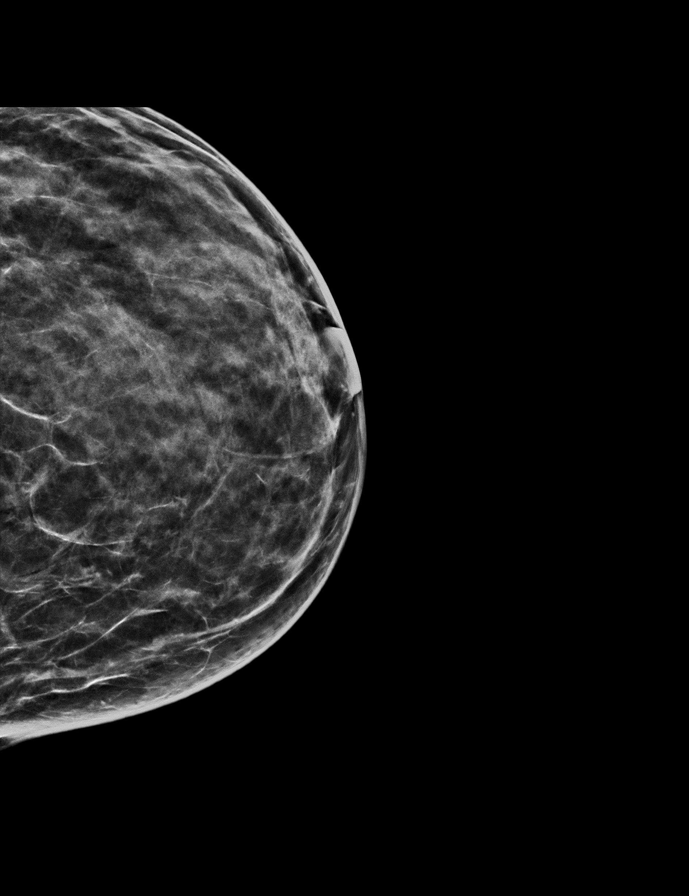

[R CC synth-2D]
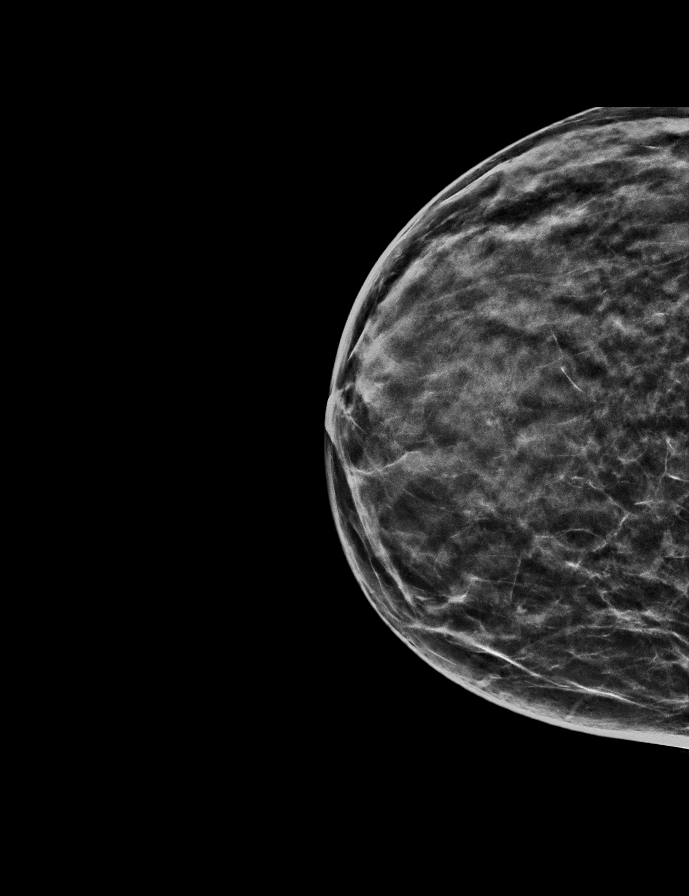

[L MLO synth-2D]
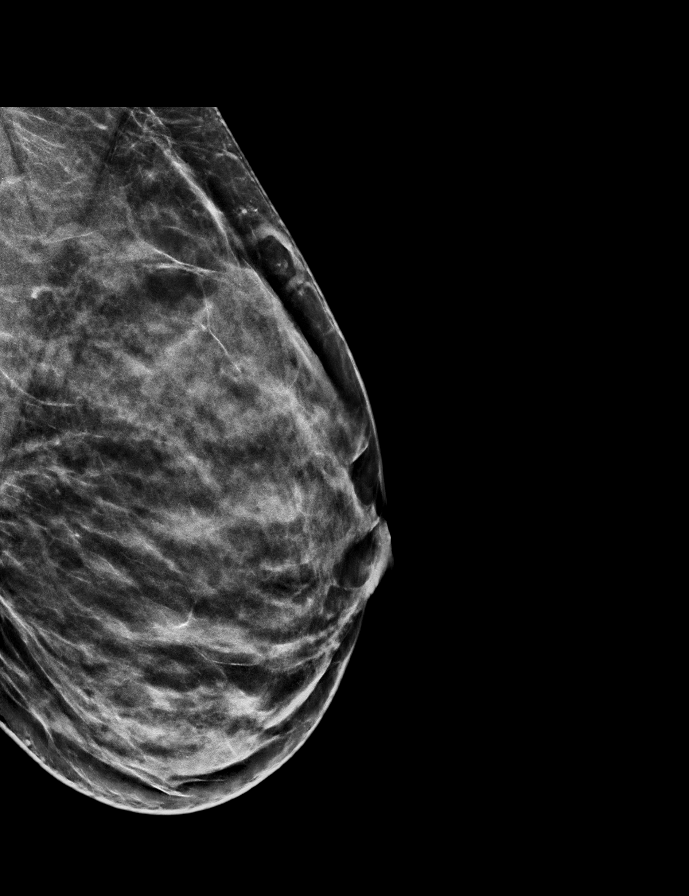

[R MLO synth-2D]
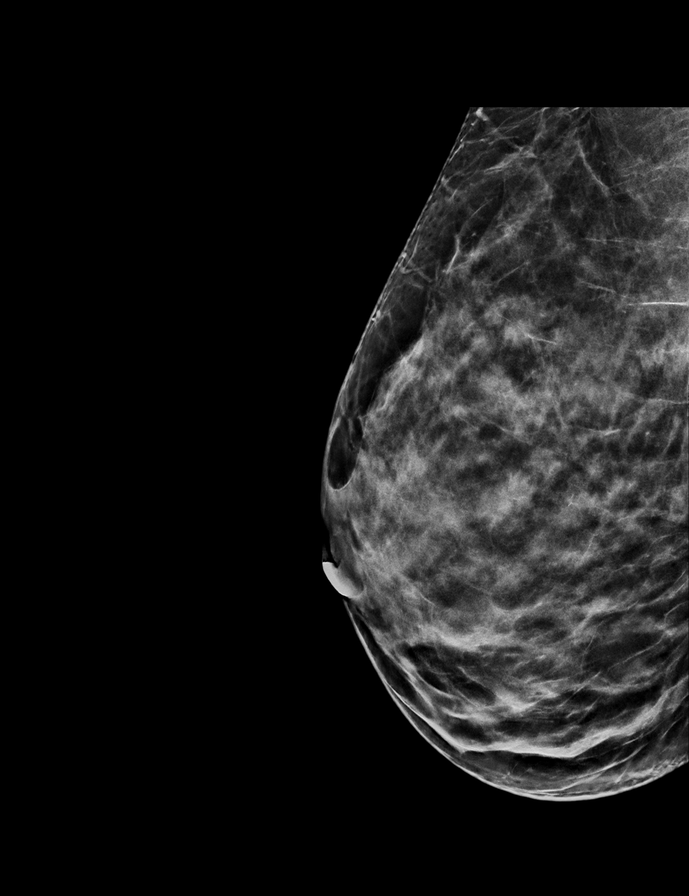

[L CC tomo · 2 of 49 frames shown]
[frame 16/49]
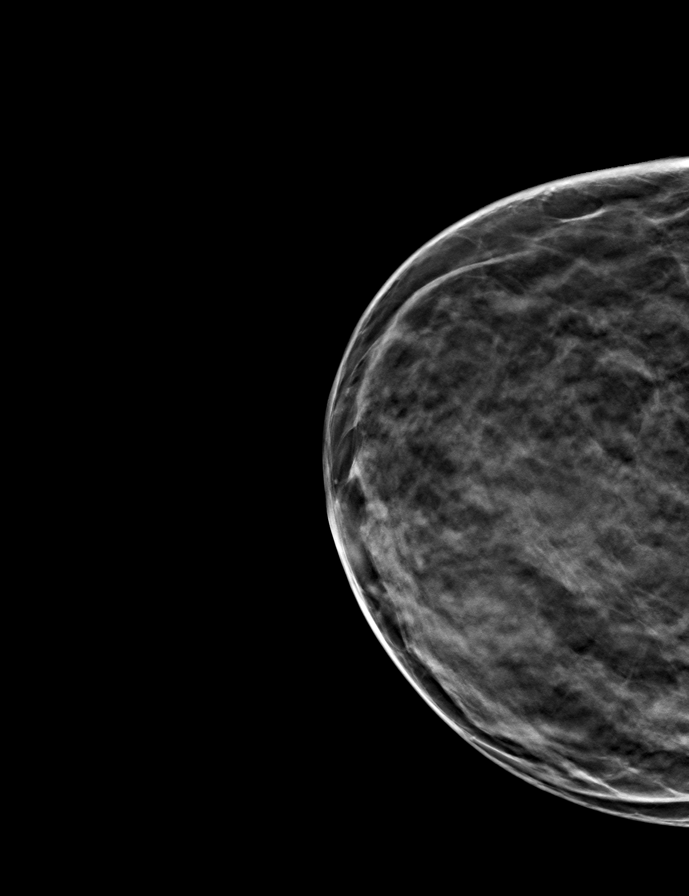
[frame 25/49]
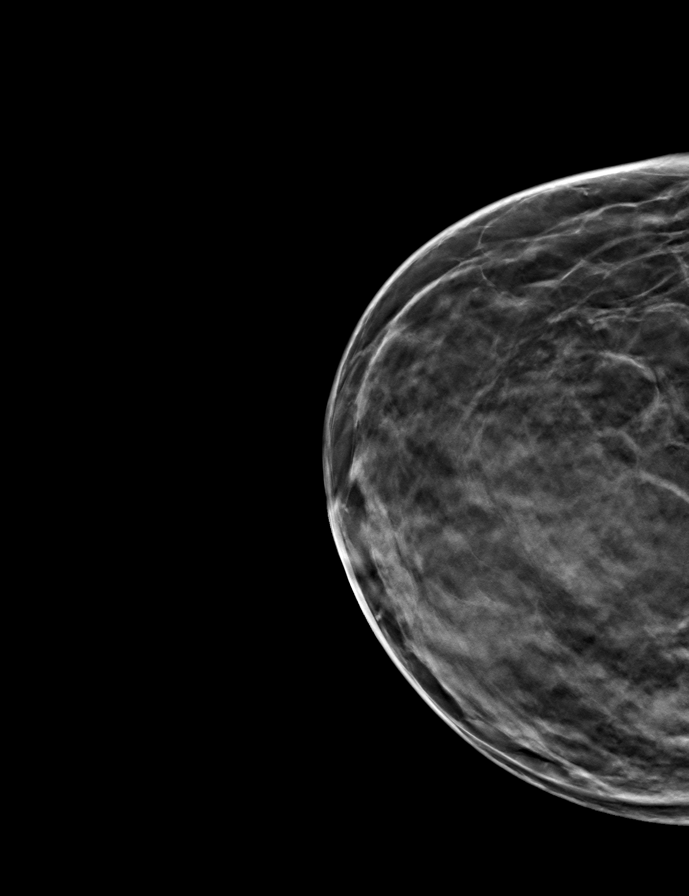

[R MLO tomo · tomo slice 27/54.0]
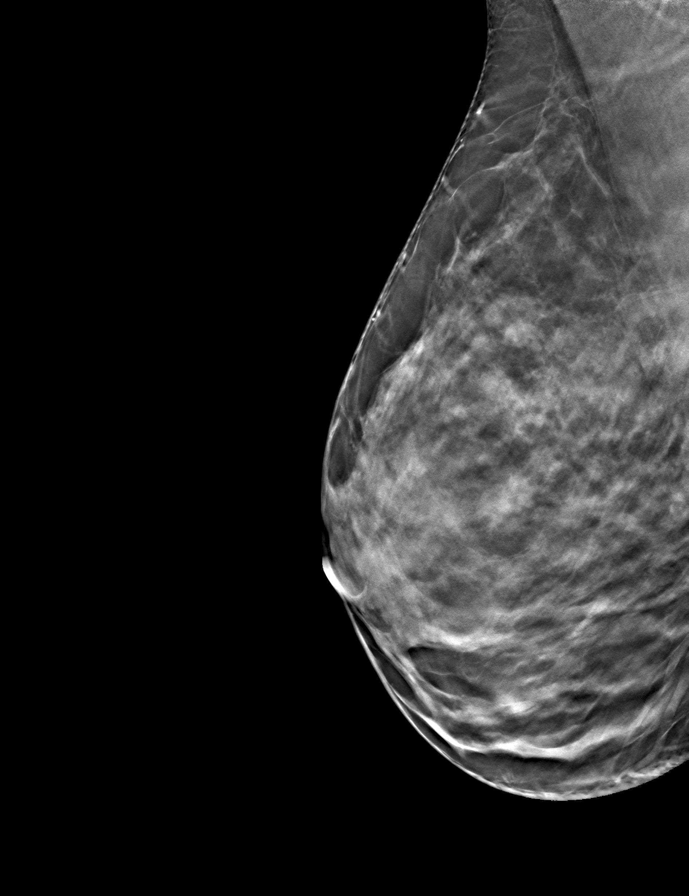

[L MLO tomo · tomo slice 29/56.0]
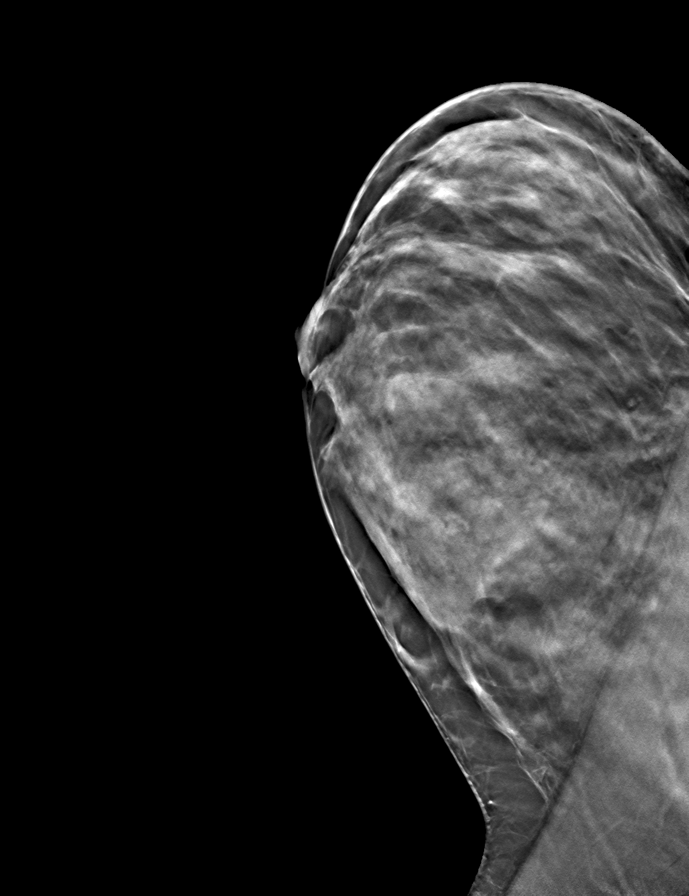

[R CC tomo · tomo slice 25/50.0]
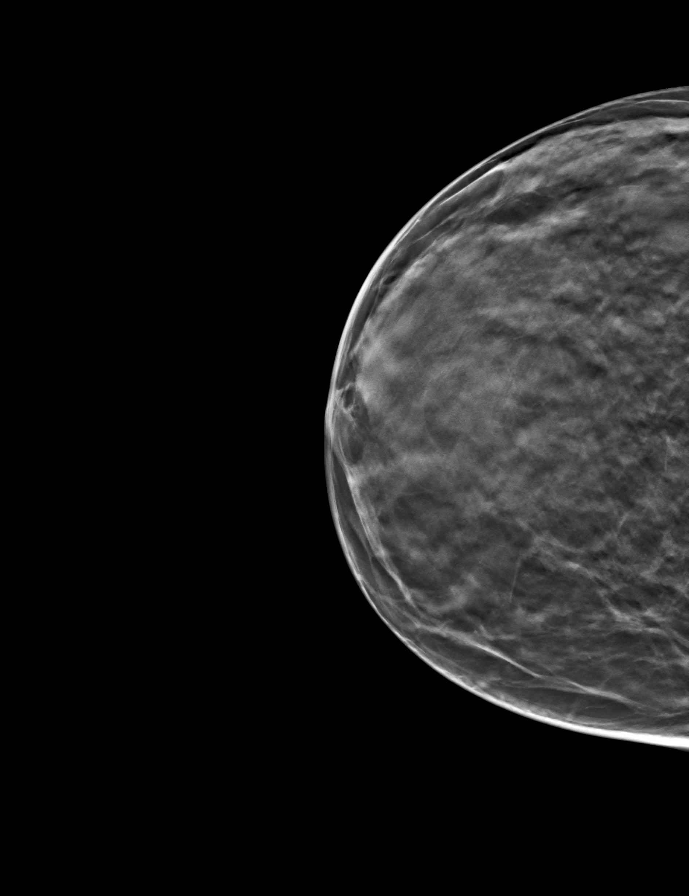

[9 of 24 positions shown; findings below may reference images not displayed]

ACR Breast Density Category c: The breast tissue is heterogeneously
dense, which may obscure small masses.
FINDINGS: There are no findings suspicious for malignancy. Images were
processed with CAD.
IMPRESSION: No mammographic evidence of malignancy. A result letter of this
screening mammogram will be mailed directly to the patient.

RECOMMENDATION:
Screening mammogram in one year. (Code:FT-U-LHB)

BI-RADS CATEGORY  1: Negative.

## 2022-06-26 ENCOUNTER — Ambulatory Visit
Admission: EM | Admit: 2022-06-26 | Discharge: 2022-06-26 | Disposition: A | Discharge status: Home / Self Care | Payer: Commercial Managed Care - PPO | Attending: Urgent Care | Admitting: Urgent Care

## 2022-06-26 ENCOUNTER — Other Ambulatory Visit: Payer: Self-pay

## 2022-06-26 ENCOUNTER — Telehealth: Payer: Self-pay | Admitting: Emergency Medicine

## 2022-06-26 DIAGNOSIS — R531 Weakness: Secondary | ICD-10-CM | POA: Diagnosis present

## 2022-06-26 DIAGNOSIS — R112 Nausea with vomiting, unspecified: Secondary | ICD-10-CM | POA: Diagnosis not present

## 2022-06-26 DIAGNOSIS — J029 Acute pharyngitis, unspecified: Secondary | ICD-10-CM

## 2022-06-26 LAB — POCT URINALYSIS DIP (MANUAL ENTRY)
Bilirubin, UA: NEGATIVE
Glucose, UA: NEGATIVE mg/dL
Ketones, POC UA: NEGATIVE mg/dL
Leukocytes, UA: NEGATIVE
Nitrite, UA: NEGATIVE
Protein Ur, POC: NEGATIVE mg/dL
Spec Grav, UA: 1.01 (ref 1.010–1.025)
Urobilinogen, UA: 0.2 E.U./dL
pH, UA: 7 (ref 5.0–8.0)

## 2022-06-26 LAB — POCT RAPID STREP A (OFFICE): Rapid Strep A Screen: NEGATIVE

## 2022-06-26 LAB — POC SARS CORONAVIRUS 2 AG -  ED: SARS Coronavirus 2 Ag: NEGATIVE

## 2022-06-26 MED ORDER — LIDOCAINE VISCOUS HCL 2 % MT SOLN: 5.0000 mL | OROMUCOSAL | 0 refills | Status: AC | PRN

## 2022-06-26 MED ORDER — CIPROFLOXACIN HCL 500 MG PO TABS: 500.0000 mg | ORAL_TABLET | Freq: Two times a day (BID) | ORAL | 0 refills | Status: AC

## 2022-06-26 MED ORDER — ONDANSETRON 4 MG PO TBDP: 4.0000 mg | ORAL_TABLET | Freq: Three times a day (TID) | ORAL | 0 refills | Status: AC | PRN

## 2022-06-26 MED ORDER — ONDANSETRON 4 MG PO TBDP
4.0000 mg | ORAL_TABLET | Freq: Once | ORAL | Status: AC
  Administered 2022-06-26: 4 mg via ORAL

## 2022-06-26 MED ORDER — NYSTATIN 100000 UNIT/ML MT SUSP: 5.0000 mL | Freq: Three times a day (TID) | OROMUCOSAL | 0 refills | Status: DC | PRN

## 2022-06-26 NOTE — Telephone Encounter (Signed)
Call to [facility] to let her know that an alternative medication was sent in place of magic mouthwash- message left

## 2022-06-26 NOTE — ED Provider Notes (Signed)
[facility]    CSN: [id number] Arrival date & time: 06/26/22  0810      History   Chief Complaint Chief Complaint  Patient presents with   Sore Throat    HPI Danielle Vincent is a 42 y.o. female.   Pleasant 42 year old female presents today due to concerns of acute illness.  She states that they were in the [place] this past weekend at a resort.  2 days into the trip, they were at a dinner and patient ate a salad with raw tuna on it.  Roughly 4 hours after eating the salad, she started projectile vomiting.  She was the only one of the group that ate this tuna, and she is the only one that is sick.  She had a fever for about 3 days from Sunday through Tuesday, and has had vomiting and diarrhea for the past 4 days.  She states the vomiting and diarrhea has stopped, but she also has not really been eating anything for the past week.  She reports feeling weak and fatigued.  She was seen by a medic in the [place] and recommended to be transported to the hospital which she refused at that time.  She was then treated with Zofran and acetaminophen at the resort.  She came in today with a primary concern of a sore throat.  She states last evening she started developing discomfort, and woke up this morning with it on fire.   Sore Throat    Past Medical History:  Diagnosis Date   Molar pregnancy 2012   Positive PPD, treated    Supervision of other normal pregnancy 10/04/2011    Genetic Screen Neg [place], XX Anatomic [place] Normal female Glucose Screen  GBS  Feeding Preference Breast Contraception  Circumcision n/a     Thrombocytopenia complicating pregnancy (HCC)     Patient Active Problem List   Diagnosis Date Noted   Status post vaginal hysterectomy 01/17/2020   History of molar pregnancy, antepartum 08/08/2013    Past Surgical History:  Procedure Laterality Date   DILATION AND CURETTAGE OF UTERUS     KNEE SURGERY  1995   VAGINAL DELIVERY      x3   VAGINAL HYSTERECTOMY Bilateral 12/04/2019   Procedure: HYSTERECTOMY VAGINAL WITH SALPINGECTOMY;  Surgeon: Conan Bowens, MD;  Location: [facility];  Service: Gynecology;  Laterality: Bilateral;   WISDOM TOOTH EXTRACTION      OB History     Gravida  4   Para  3   Term  3   Preterm      AB  1   Living  3      SAB  0   IAB      Ectopic      Multiple      Live Births  3            Home Medications    Prior to Admission medications   Medication Sig Start Date End Date Taking? Authorizing Provider  ciprofloxacin (CIPRO) 500 MG tablet Take 1 tablet (500 mg total) by mouth every 12 (twelve) hours for 7 days. 06/26/22 07/03/22 Yes Danielle Vincent L, PA  magic mouthwash (nystatin, lidocaine, diphenhydrAMINE, alum & mag hydroxide) suspension Swish and swallow 5 mLs 3 (three) times daily as needed for mouth pain. 06/26/22  Yes Danielle Vincent L, PA  ondansetron (ZOFRAN-ODT) 4 MG disintegrating tablet Take 1 tablet (4 mg total) by mouth every 8 (eight) hours as needed  for nausea or vomiting. 06/26/22  Yes Danielle Vincent L, PA  ibuprofen (ADVIL) 600 MG tablet Take 1 tablet (600 mg total) by mouth every 6 (six) hours as needed for headache, mild pain, moderate pain or cramping. 12/04/19   Conan Bowens, MD    Family History Family History  Problem Relation Age of Onset   Cancer Maternal Grandmother        lung   Diabetes Maternal Grandmother    Hyperlipidemia Maternal Grandmother    Osteoporosis Maternal Grandmother    Diabetes Paternal Grandmother    Diabetes Paternal Grandfather    Hyperlipidemia Mother     Social History Social History   Tobacco Use   Smoking status: Never   Smokeless tobacco: Never  Vaping Use   Vaping Use: Never used  Substance Use Topics   Alcohol use: Yes    Comment: Social   Drug use: No     Allergies   Patient has no known allergies.   Review of Systems Review of Systems As per HPI  Physical Exam Triage  Vital Signs ED Triage Vitals  Enc Vitals Group     BP 06/26/22 0817 (!) 144/92     Pulse Rate 06/26/22 0817 73     Resp 06/26/22 0817 17     Temp 06/26/22 0817 98.4 F (36.9 C)     Temp Source 06/26/22 0817 Oral     SpO2 06/26/22 0817 100 %     Weight --      Height --      Head Circumference --      Peak Flow --      Pain Score 06/26/22 0818 7     Pain Loc --      Pain Edu? --      Excl. in GC? --    No data found.  Updated Vital Signs BP (!) 144/92 (BP Location: Right Arm)   Pulse 73   Temp 98.4 F (36.9 C) (Oral)   Resp 17   LMP 11/26/2019   SpO2 100%   Visual Acuity Right Eye Distance:   Left Eye Distance:   Bilateral Distance:    Right Eye Near:   Left Eye Near:    Bilateral Near:     Physical Exam Vitals and nursing note reviewed. Exam conducted with a chaperone present.  Constitutional:      General: She is not in acute distress.    Appearance: Normal appearance. She is well-developed. She is not ill-appearing, toxic-appearing or diaphoretic.  HENT:     Head: Normocephalic and atraumatic.     Right Ear: Tympanic membrane, ear canal and external ear normal. There is no impacted cerumen.     Left Ear: Tympanic membrane, ear canal and external ear normal. There is no impacted cerumen.     Nose: Nose normal. No congestion or rhinorrhea.     Mouth/Throat:     Mouth: Mucous membranes are moist.     Pharynx: Oropharynx is clear. No oropharyngeal exudate or posterior oropharyngeal erythema (minimal erythema to posterior pharynx).  Eyes:     General: No scleral icterus.       Right eye: No discharge.        Left eye: No discharge.     Extraocular Movements: Extraocular movements intact.     Conjunctiva/sclera: Conjunctivae normal.     Pupils: Pupils are equal, round, and reactive to light.  Cardiovascular:     Rate and Rhythm: Normal rate and regular rhythm.  Heart sounds: No murmur heard. Pulmonary:     Effort: Pulmonary effort is normal. No  respiratory distress.     Breath sounds: Normal breath sounds. No stridor. No wheezing, rhonchi or rales.  Abdominal:     General: Abdomen is flat. Bowel sounds are increased. There is no distension.     Palpations: Abdomen is soft. There is no hepatomegaly or splenomegaly.     Tenderness: There is generalized abdominal tenderness (mild generalized discomfort without signs of peritonitis or acute abdomen). There is no right CVA tenderness, left CVA tenderness, guarding or rebound.  Musculoskeletal:        General: No swelling.     Cervical back: Normal range of motion and neck supple. No rigidity or tenderness.  Lymphadenopathy:     Cervical: No cervical adenopathy.  Skin:    General: Skin is warm and dry.     Capillary Refill: Capillary refill takes less than 2 seconds.  Neurological:     Mental Status: She is alert.  Psychiatric:        Mood and Affect: Mood normal.      UC Treatments / Results  Labs (all labs ordered are listed, but only abnormal results are displayed) Labs Reviewed  POCT URINALYSIS DIP (MANUAL ENTRY) - Abnormal; Notable for the following components:      Result Value   Blood, UA trace-intact (*)    All other components within normal limits  CULTURE, GROUP A STREP ([facility])  COMPREHENSIVE METABOLIC PANEL  CBC WITH DIFFERENTIAL/PLATELET  POC SARS CORONAVIRUS 2 AG -  ED  POCT RAPID STREP A (OFFICE)    EKG   Radiology No results found.  Procedures Procedures (including critical care time)  Medications Ordered in UC Medications  ondansetron (ZOFRAN-ODT) disintegrating tablet 4 mg (4 mg Oral Given 06/26/22 0855)    Initial Impression / Assessment and Plan / UC Course  I have reviewed the triage vital signs and the nursing notes.  Pertinent labs & imaging results that were available during my care of the patient were reviewed by me and considered in my medical decision making (see chart for details).     Generalized weakness - suspect secondary to  recent GI bug. Question vibrio infection given raw seafood prior to sx onset. VSS. Urine specific gravity WNL And mucous membranes moist therefore no concern for significant dehydration. CBC and CMP obtained to assess for electrolyte abnormalities. Pharyngitis - covid and strep testing negative. Suspect secondary to recent episodes of vomiting. Throat cx obtained. Magic mouthwash for sx support. N/V - nausea persists. Vomiting and diarrhea has subsided but pt admits she has not eaten much in the past week. Will give zofran. BRAT diet and fluid replacement discussed. Will give cipro should sx persist for concern of possible vibrio.   Final Clinical Impressions(s) / UC Diagnoses   Final diagnoses:  Weakness generalized  Viral pharyngitis  Nausea and vomiting, unspecified vomiting type     Discharge Instructions      Your urine appears normal, it shows adequate hydration. We drew labs to assess for possible electrolyte abnormalities and will notify you once received. (24-48 hours) Your strep test is negative, we will send out a throat culture for confirmation. Your covid test is negative.  I suspect your sore throat is secondary to your recent vomiting. I have called in a mouthwash to swish and swallow to help improve your symptoms.  It is possible that you had GI illness due to vibrio infection.  Usually this is  self limited (meaning resolves itself) but occasionally needs antibiotics.  Please start taking the zofran as needed for nausea and vomiting. Drink plenty of pedialyte and gatorade to replenish losses. Eat bland foods such as crackers or toast.  If your symptoms persist after Sunday, then please start taking the antibiotic called in today. Keep in mind, all antibiotics have the possibility of GI side effects (nausea or diarrhea) so only take if your current symptoms persist. Cipro has a BBW for risk of tendon rupture. This risk is low in patients <65yo. Avoid excessive sun  exposure if you start taking the medication.     ED Prescriptions     Medication Sig Dispense Auth. Provider   ondansetron (ZOFRAN-ODT) 4 MG disintegrating tablet Take 1 tablet (4 mg total) by mouth every 8 (eight) hours as needed for nausea or vomiting. 20 tablet Danielle Vincent L, PA   magic mouthwash (nystatin, lidocaine, diphenhydrAMINE, alum & mag hydroxide) suspension Swish and swallow 5 mLs 3 (three) times daily as needed for mouth pain. 60 mL Danielle Vincent L, PA   ciprofloxacin (CIPRO) 500 MG tablet Take 1 tablet (500 mg total) by mouth every 12 (twelve) hours for 7 days. 14 tablet Danielle Vincent, [place]      PDMP not reviewed this encounter.   Danielle Vincent, [place] 06/26/22 [phone]

## 2022-06-26 NOTE — Telephone Encounter (Signed)
Fax received from pharmacy- unable to compound magic mouthwash - provider updated- alternative to be sent in

## 2022-06-26 NOTE — Telephone Encounter (Signed)
Pharmacy will not compound medications anymore therefore pt requesting viscous lidocaine in place of magic mouthwash.

## 2022-06-26 NOTE — Discharge Instructions (Addendum)
Your urine appears normal, it shows adequate hydration. We drew labs to assess for possible electrolyte abnormalities and will notify you once received. (24-48 hours) Your strep test is negative, we will send out a throat culture for confirmation. Your covid test is negative.  I suspect your sore throat is secondary to your recent vomiting. I have called in a mouthwash to swish and swallow to help improve your symptoms.  It is possible that you had GI illness due to vibrio infection.  Usually this is self limited (meaning resolves itself) but occasionally needs antibiotics.  Please start taking the zofran as needed for nausea and vomiting. Drink plenty of pedialyte and gatorade to replenish losses. Eat bland foods such as crackers or toast.  If your symptoms persist after Sunday, then please start taking the antibiotic called in today. Keep in mind, all antibiotics have the possibility of GI side effects (nausea or diarrhea) so only take if your current symptoms persist. Cipro has a BBW for risk of tendon rupture. This risk is low in patients <65yo. Avoid excessive sun exposure if you start taking the medication.

## 2022-06-26 NOTE — ED Triage Notes (Addendum)
Pt c/o sore throat since yesterday. Tylenol prn. Recently returned from [place]. Was sick recently with bad food poisoning. States still feeling weak from that.

## 2022-06-27 LAB — COMPREHENSIVE METABOLIC PANEL
ALT: 13 IU/L (ref 0–32)
AST: 26 IU/L (ref 0–40)
Albumin/Globulin Ratio: 1.6 (ref 1.2–2.2)
Albumin: 4.1 g/dL (ref 3.9–4.9)
Alkaline Phosphatase: 60 IU/L (ref 44–121)
BUN/Creatinine Ratio: 8 — ABNORMAL LOW (ref 9–23)
BUN: 6 mg/dL (ref 6–24)
Bilirubin Total: 0.4 mg/dL (ref 0.0–1.2)
CO2: 23 mmol/L (ref 20–29)
Calcium: 9.2 mg/dL (ref 8.7–10.2)
Chloride: 101 mmol/L (ref 96–106)
Creatinine, Ser: 0.76 mg/dL (ref 0.57–1.00)
Globulin, Total: 2.6 g/dL (ref 1.5–4.5)
Glucose: 88 mg/dL (ref 70–99)
Potassium: 3.9 mmol/L (ref 3.5–5.2)
Sodium: 139 mmol/L (ref 134–144)
Total Protein: 6.7 g/dL (ref 6.0–8.5)
eGFR: 100 mL/min/{1.73_m2} (ref >59)

## 2022-06-27 LAB — CBC WITH DIFFERENTIAL/PLATELET
Basophils Absolute: 0.1 10*3/uL (ref 0.0–0.2)
Basos: 1 %
EOS (ABSOLUTE): 0 10*3/uL (ref 0.0–0.4)
Eos: 0 %
Hematocrit: 40.8 % (ref 34.0–46.6)
Hemoglobin: 13.7 g/dL (ref 11.1–15.9)
Immature Grans (Abs): 0 10*3/uL (ref 0.0–0.1)
Immature Granulocytes: 1 %
Lymphocytes Absolute: 1.3 10*3/uL (ref 0.7–3.1)
Lymphs: 18 %
MCH: 31.3 pg (ref 26.6–33.0)
MCHC: 33.6 g/dL (ref 31.5–35.7)
MCV: 93 fL (ref 79–97)
Monocytes Absolute: 0.7 10*3/uL (ref 0.1–0.9)
Monocytes: 10 %
Neutrophils Absolute: 5.1 10*3/uL (ref 1.4–7.0)
Neutrophils: 70 %
Platelets: 201 10*3/uL (ref 150–450)
RBC: 4.38 x10E6/uL (ref 3.77–5.28)
RDW: 12.4 % (ref 11.7–15.4)
WBC: 7.2 10*3/uL (ref 3.4–10.8)

## 2022-06-29 LAB — CULTURE, GROUP A STREP (THRC)
